# Patient Record
Sex: Female | Born: 1960 | Race: White | Hispanic: No | Marital: Married | Smoking: Never smoker
Health system: Southern US, Community
[De-identification: ages and names within clinical notes are randomized; demographics above are authoritative.]

## PROBLEM LIST (undated history)

## (undated) ENCOUNTER — Encounter

## (undated) ENCOUNTER — Encounter: Attending: Family | Primary: Family

## (undated) ENCOUNTER — Ambulatory Visit: Payer: PRIVATE HEALTH INSURANCE | Attending: Rheumatology | Primary: Rheumatology

## (undated) ENCOUNTER — Encounter: Attending: Ambulatory Care | Primary: Ambulatory Care

## (undated) ENCOUNTER — Encounter: Attending: Rheumatology | Primary: Rheumatology

## (undated) ENCOUNTER — Ambulatory Visit: Payer: PRIVATE HEALTH INSURANCE | Attending: Family | Primary: Family

## (undated) ENCOUNTER — Telehealth

## (undated) ENCOUNTER — Ambulatory Visit: Payer: PRIVATE HEALTH INSURANCE

## (undated) ENCOUNTER — Telehealth: Attending: Ambulatory Care | Primary: Ambulatory Care

## (undated) ENCOUNTER — Ambulatory Visit

## (undated) ENCOUNTER — Telehealth: Attending: Rheumatology | Primary: Rheumatology

## (undated) DIAGNOSIS — C439 Malignant melanoma of skin, unspecified: Secondary | ICD-10-CM

## (undated) DIAGNOSIS — E079 Disorder of thyroid, unspecified: Secondary | ICD-10-CM

## (undated) HISTORY — PX: OTHER SURGICAL HISTORY: SHX169

## (undated) HISTORY — PX: BREAST REDUCTION SURGERY: SHX8

## (undated) MED ORDER — BIOTIN ORAL: ORAL | 0.00000 days

## (undated) MED ORDER — FEXOFENADINE 180 MG TABLET: Freq: Every day | ORAL | 0.00000 days

## (undated) MED ORDER — FIBER ORAL: ORAL | 0.00000 days

## (undated) MED ORDER — MAGNESIUM 30 MG TABLET: Freq: Every evening | ORAL | 0.00000 days

## (undated) MED ORDER — CETIRIZINE 10 MG TABLET: Freq: Every day | ORAL | 0 days

## (undated) MED ORDER — CYANOCOBALAMIN (VIT B-12) 1,000 MCG TABLET: Freq: Every day | ORAL | 0.00000 days

## (undated) MED ORDER — ACETAMINOPHEN 500 MG TABLET: ORAL | 0 days

## (undated) MED ORDER — IBUPROFEN 200 MG TABLET: Freq: Four times a day (QID) | ORAL | 0 days | PRN

---

## 2002-02-10 ENCOUNTER — Other Ambulatory Visit: Admission: RE | Admit: 2002-02-10 | Discharge: 2002-02-10 | Payer: Self-pay | Admitting: Obstetrics and Gynecology

## 2003-08-03 ENCOUNTER — Other Ambulatory Visit: Admission: RE | Admit: 2003-08-03 | Discharge: 2003-08-03 | Payer: Self-pay | Admitting: Obstetrics and Gynecology

## 2004-09-02 ENCOUNTER — Other Ambulatory Visit: Admission: RE | Admit: 2004-09-02 | Discharge: 2004-09-02 | Payer: Self-pay | Admitting: Obstetrics and Gynecology

## 2005-11-11 ENCOUNTER — Other Ambulatory Visit: Admission: RE | Admit: 2005-11-11 | Discharge: 2005-11-11 | Payer: Self-pay | Admitting: Obstetrics and Gynecology

## 2006-08-24 ENCOUNTER — Encounter: Admission: RE | Admit: 2006-08-24 | Discharge: 2006-08-24 | Payer: Self-pay | Admitting: Obstetrics and Gynecology

## 2008-02-15 ENCOUNTER — Encounter: Admission: RE | Admit: 2008-02-15 | Discharge: 2008-02-15 | Payer: Self-pay | Admitting: Obstetrics and Gynecology

## 2009-07-27 ENCOUNTER — Encounter: Admission: RE | Admit: 2009-07-27 | Discharge: 2009-07-27 | Payer: Self-pay | Admitting: Obstetrics and Gynecology

## 2009-08-04 ENCOUNTER — Encounter: Admission: RE | Admit: 2009-08-04 | Discharge: 2009-08-04 | Payer: Self-pay | Admitting: Family Medicine

## 2009-08-18 ENCOUNTER — Emergency Department (HOSPITAL_COMMUNITY): Admission: EM | Admit: 2009-08-18 | Discharge: 2009-08-18 | Payer: Self-pay | Admitting: Emergency Medicine

## 2009-11-12 ENCOUNTER — Encounter: Admission: RE | Admit: 2009-11-12 | Discharge: 2009-11-12 | Payer: Self-pay | Admitting: Family Medicine

## 2010-07-29 ENCOUNTER — Encounter: Admission: RE | Admit: 2010-07-29 | Discharge: 2010-07-29 | Payer: Self-pay | Admitting: Obstetrics and Gynecology

## 2011-10-13 ENCOUNTER — Other Ambulatory Visit: Payer: Self-pay | Admitting: Obstetrics and Gynecology

## 2011-10-13 DIAGNOSIS — Z1231 Encounter for screening mammogram for malignant neoplasm of breast: Secondary | ICD-10-CM

## 2011-10-20 ENCOUNTER — Ambulatory Visit
Admission: RE | Admit: 2011-10-20 | Discharge: 2011-10-20 | Disposition: A | Discharge status: Home / Self Care | Payer: 59 | Source: Ambulatory Visit | Attending: Obstetrics and Gynecology | Admitting: Obstetrics and Gynecology

## 2011-10-20 DIAGNOSIS — Z1231 Encounter for screening mammogram for malignant neoplasm of breast: Secondary | ICD-10-CM

## 2011-12-03 ENCOUNTER — Other Ambulatory Visit: Payer: Self-pay

## 2011-12-03 ENCOUNTER — Encounter (HOSPITAL_BASED_OUTPATIENT_CLINIC_OR_DEPARTMENT_OTHER): Payer: Self-pay | Admitting: *Deleted

## 2011-12-03 ENCOUNTER — Emergency Department (HOSPITAL_BASED_OUTPATIENT_CLINIC_OR_DEPARTMENT_OTHER)
Admission: EM | Admit: 2011-12-03 | Discharge: 2011-12-03 | Disposition: A | Discharge status: Home / Self Care | Payer: 59 | Attending: Emergency Medicine | Admitting: Emergency Medicine

## 2011-12-03 ENCOUNTER — Emergency Department (INDEPENDENT_AMBULATORY_CARE_PROVIDER_SITE_OTHER): Payer: 59

## 2011-12-03 DIAGNOSIS — R0789 Other chest pain: Secondary | ICD-10-CM

## 2011-12-03 DIAGNOSIS — R002 Palpitations: Secondary | ICD-10-CM

## 2011-12-03 DIAGNOSIS — R079 Chest pain, unspecified: Secondary | ICD-10-CM

## 2011-12-03 DIAGNOSIS — Z79899 Other long term (current) drug therapy: Secondary | ICD-10-CM | POA: Insufficient documentation

## 2011-12-03 HISTORY — DX: Malignant melanoma of skin, unspecified: C43.9

## 2011-12-03 HISTORY — DX: Disorder of thyroid, unspecified: E07.9

## 2011-12-03 LAB — COMPREHENSIVE METABOLIC PANEL
Alkaline Phosphatase: 80 U/L (ref 39–117)
BUN: 12 mg/dL (ref 6–23)
Creatinine, Ser: 0.7 mg/dL (ref 0.50–1.10)
GFR calc Af Amer: >90 mL/min (ref >90)
Glucose, Bld: 141 mg/dL — ABNORMAL HIGH (ref 70–99)
Potassium: 3.8 mEq/L (ref 3.5–5.1)
Total Bilirubin: 0.2 mg/dL — ABNORMAL LOW (ref 0.3–1.2)
Total Protein: 7.2 g/dL (ref 6.0–8.3)

## 2011-12-03 LAB — CBC
HCT: 41.7 % (ref 36.0–46.0)
Hemoglobin: 14.4 g/dL (ref 12.0–15.0)
MCH: 32.5 pg (ref 26.0–34.0)
MCV: 94.1 fL (ref 78.0–100.0)
RBC: 4.43 MIL/uL (ref 3.87–5.11)

## 2011-12-03 LAB — DIFFERENTIAL
Eosinophils Absolute: 0.2 10*3/uL (ref 0.0–0.7)
Lymphs Abs: 2.8 10*3/uL (ref 0.7–4.0)
Monocytes Absolute: 0.5 10*3/uL (ref 0.1–1.0)
Monocytes Relative: 6 % (ref 3–12)
Neutrophils Relative %: 62 % (ref 43–77)

## 2011-12-03 NOTE — ED Provider Notes (Signed)
History     CSN: [id number]  Arrival date & time 12/03/11  1336   First MD Initiated Contact with Patient 12/03/11 1426      Chief Complaint  Patient presents with  . 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 female presenting with chest pain. The history is provided by the patient. No language interpreter was used.  Chest Pain The chest pain began 5 - 7 days ago. Chest pain occurs intermittently. The chest pain is unchanged. The severity of the pain is mild. The quality of the pain is described as pressure-like. The pain does not radiate. Primary symptoms include palpitations. Pertinent negatives for primary symptoms include no fever, no syncope, no shortness of breath, no cough, no wheezing, no abdominal pain, no nausea, no vomiting and no dizziness.  The palpitations did not occur with dizziness or shortness of breath.  Pertinent negatives for associated symptoms include no diaphoresis and no numbness. She tried nothing for the symptoms.     Past Medical History  Diagnosis Date  . Thyroid disease   . Melanoma     Past Surgical History  Procedure Date  . Melanoma removal     back   . Breast reduction surgery     No family history on file.  History  Substance Use Topics  . Smoking status: Never Smoker   . Smokeless  tobacco: Not on file  . Alcohol Use: Yes     occassionally    OB History    Grav Para Term Preterm Abortions TAB SAB Ect Mult Living                  Review of Systems  Constitutional: Negative for fever and diaphoresis.  Respiratory: Negative for cough, shortness of breath and wheezing.   Cardiovascular: Positive for chest pain and palpitations. Negative for syncope.  Gastrointestinal: Negative for nausea, vomiting and abdominal pain.  Neurological: Negative for dizziness and numbness.  All other systems reviewed and are negative.    Allergies  Review of patient's allergies indicates no known allergies.  Home Medications   Current Outpatient Rx  Name Route Sig Dispense Refill  . FAMOTIDINE-CA CARB-MAG HYDROX 10-800-165 MG PO CHEW Oral Chew 1 tablet by mouth daily as needed.    Marland Kitchen LANSOPRAZOLE 30 MG PO CPDR Oral Take 30 mg by mouth daily.    Marland Kitchen LEVONORGEST-ETH ESTRAD 91-DAY 0.15-0.03 MG PO TABS Oral Take 1 tablet by mouth daily.    Marland Kitchen LEVOTHYROXINE SODIUM 100 MCG PO TABS Oral Take 100 mcg by mouth daily.      BP 123/85  Pulse 83  Temp(Src) 98.2 F (36.8 C) (Oral)  Resp 20  Ht 5\' 5"  (1.651 m)  Wt 224 lb (101.606 kg)  BMI 37.28 kg/m2  SpO2 100%  Physical Exam  Nursing note and vitals reviewed. Constitutional: She is oriented to person, place,  and time. She appears well-developed and well-nourished.  HENT:  Head: Normocephalic and atraumatic.  Eyes: Conjunctivae and EOM are normal.  Neck: Neck supple.  Cardiovascular: Normal rate and regular rhythm.   Pulmonary/Chest: Effort normal and breath sounds normal. She exhibits no tenderness.  Abdominal: Soft. Bowel sounds are normal. There is no tenderness.  Musculoskeletal: Normal range of motion.  Neurological: She is alert and oriented to person, place, and time.  Skin: Skin is warm and dry.  Psychiatric: She has a normal mood and affect.    ED Course  Procedures (including critical care time)  Labs Reviewed    COMPREHENSIVE METABOLIC PANEL - Abnormal; Notable for the following:    Glucose, Bld 141 (*)    Total Bilirubin 0.2 (*)    All other components within normal limits  CARDIAC PANEL(CRET KIN+CKTOT+MB+TROPI)  CBC  DIFFERENTIAL   No results found.  Date: 12/03/2011  Rate: 82  Rhythm: normal sinus rhythm  QRS Axis: normal  Intervals: normal  ST/T Wave abnormalities: normal  Conduction Disutrbances:none  Narrative Interpretation:   Old EKG Reviewed: none available    1. Palpitations       MDM  No acute findings:discussed with pt about setting up holter monitor on outpt basis:doubt acs and pe        Teressa Lower, NP 12/03/11 1543

## 2011-12-03 NOTE — Discharge Instructions (Signed)
Palpitations  A palpitation is the feeling that your heartbeat is irregular or is faster than normal. Although this is frightening, it usually is not serious. Palpitations may be caused by excesses of smoking, caffeine, or alcohol. They are also brought on by stress and anxiety. Sometimes, they are caused by heart disease. Unless otherwise noted, your caregiver did not find any signs of serious illness at this time. HOME CARE INSTRUCTIONS  To help prevent palpitations:  Drink decaffeinated coffee, tea, and soda pop. Avoid chocolate.   If you smoke or drink alcohol, quit or cut down as much as possible.   Reduce your stress or anxiety level. Biofeedback, yoga, or meditation will help you relax. Physical activity such as swimming, jogging, or walking also may be helpful.  SEEK MEDICAL CARE IF:   You continue to have a fast heartbeat.   Your palpitations occur more often.  SEEK IMMEDIATE MEDICAL CARE IF: You develop chest pain, shortness of breath, severe headache, dizziness, or fainting. Document Released: 08/29/2000 Document Revised: 08/21/2011 Document Reviewed: 10/29/2007 ExitCare Patient Information 2012 [employer]. 

## 2011-12-03 NOTE — ED Notes (Signed)
Patient states she has had a tightness and discomfort in the center of her chest one week ago.  Felt like indigestion.  Yesterday after eating lunch and walking for 30 minutes she felt the discomfort and checked her pulse.  States she felt a skipping in her heart rate.  Was seen today by her pcp for a routine physical, had a normal ekg and is going to schedule a stress test.

## 2011-12-03 NOTE — ED Notes (Signed)
Lonna Cobb NP has been in to see Pt. With reports of blood work and x ray

## 2011-12-04 NOTE — ED Provider Notes (Signed)
Medical screening examination/treatment/procedure(s) were performed by non-physician practitioner and as supervising physician I was immediately available for consultation/collaboration.    Nelia Shi, MD 12/04/11 [phone]

## 2013-01-05 ENCOUNTER — Other Ambulatory Visit: Payer: Self-pay

## 2013-01-05 DIAGNOSIS — Z1231 Encounter for screening mammogram for malignant neoplasm of breast: Secondary | ICD-10-CM

## 2013-01-05 DIAGNOSIS — Z803 Family history of malignant neoplasm of breast: Secondary | ICD-10-CM

## 2013-01-05 DIAGNOSIS — Z9889 Other specified postprocedural states: Secondary | ICD-10-CM

## 2013-02-11 ENCOUNTER — Ambulatory Visit
Admission: RE | Admit: 2013-02-11 | Discharge: 2013-02-11 | Disposition: A | Discharge status: Home / Self Care | Payer: 59 | Source: Ambulatory Visit

## 2013-02-11 DIAGNOSIS — Z1231 Encounter for screening mammogram for malignant neoplasm of breast: Secondary | ICD-10-CM

## 2013-02-11 DIAGNOSIS — Z9889 Other specified postprocedural states: Secondary | ICD-10-CM

## 2013-02-11 DIAGNOSIS — Z803 Family history of malignant neoplasm of breast: Secondary | ICD-10-CM

## 2015-04-09 ENCOUNTER — Other Ambulatory Visit: Payer: Self-pay | Admitting: Family Medicine

## 2015-04-09 DIAGNOSIS — R1030 Lower abdominal pain, unspecified: Secondary | ICD-10-CM

## 2015-04-10 ENCOUNTER — Ambulatory Visit
Admission: RE | Admit: 2015-04-10 | Discharge: 2015-04-10 | Disposition: A | Discharge status: Home / Self Care | Payer: 59 | Source: Ambulatory Visit | Attending: Family Medicine | Admitting: Family Medicine

## 2015-04-10 DIAGNOSIS — R1030 Lower abdominal pain, unspecified: Secondary | ICD-10-CM

## 2015-04-10 MED ORDER — IOPAMIDOL (ISOVUE-300) INJECTION 61%
125.0000 mL | Freq: Once | INTRAVENOUS | Status: AC | PRN
  Administered 2015-04-10: 125 mL via INTRAVENOUS

## 2015-04-20 ENCOUNTER — Other Ambulatory Visit: Payer: Self-pay | Admitting: Family Medicine

## 2015-04-20 DIAGNOSIS — R1030 Lower abdominal pain, unspecified: Secondary | ICD-10-CM

## 2015-04-23 ENCOUNTER — Other Ambulatory Visit: Payer: Self-pay | Admitting: Family Medicine

## 2015-04-23 DIAGNOSIS — N2889 Other specified disorders of kidney and ureter: Secondary | ICD-10-CM

## 2015-04-30 ENCOUNTER — Ambulatory Visit
Admission: RE | Admit: 2015-04-30 | Discharge: 2015-04-30 | Disposition: A | Discharge status: Home / Self Care | Payer: 59 | Source: Ambulatory Visit | Attending: Family Medicine | Admitting: Family Medicine

## 2015-04-30 DIAGNOSIS — R1030 Lower abdominal pain, unspecified: Secondary | ICD-10-CM

## 2015-04-30 MED ORDER — IOPAMIDOL (ISOVUE-300) INJECTION 61%
100.0000 mL | Freq: Once | INTRAVENOUS | Status: AC | PRN
  Administered 2015-04-30: 100 mL via INTRAVENOUS

## 2015-05-25 ENCOUNTER — Other Ambulatory Visit: Payer: 59

## 2015-06-13 ENCOUNTER — Ambulatory Visit
Admission: RE | Admit: 2015-06-13 | Discharge: 2015-06-13 | Disposition: A | Discharge status: Home / Self Care | Payer: 59 | Source: Ambulatory Visit | Attending: Family Medicine | Admitting: Family Medicine

## 2015-06-13 DIAGNOSIS — N2889 Other specified disorders of kidney and ureter: Secondary | ICD-10-CM

## 2015-06-13 MED ORDER — GADOBENATE DIMEGLUMINE 529 MG/ML IV SOLN
19.0000 mL | Freq: Once | INTRAVENOUS | Status: AC | PRN
  Administered 2015-06-13: 19 mL via INTRAVENOUS

## 2016-10-03 DIAGNOSIS — R5382 Chronic fatigue, unspecified: Secondary | ICD-10-CM | POA: Diagnosis not present

## 2016-10-03 DIAGNOSIS — M2550 Pain in unspecified joint: Secondary | ICD-10-CM | POA: Diagnosis not present

## 2016-10-10 DIAGNOSIS — M2550 Pain in unspecified joint: Secondary | ICD-10-CM | POA: Diagnosis not present

## 2016-10-10 DIAGNOSIS — R5382 Chronic fatigue, unspecified: Secondary | ICD-10-CM | POA: Diagnosis not present

## 2016-10-10 DIAGNOSIS — R768 Other specified abnormal immunological findings in serum: Secondary | ICD-10-CM | POA: Diagnosis not present

## 2016-11-12 DIAGNOSIS — R899 Unspecified abnormal finding in specimens from other organs, systems and tissues: Secondary | ICD-10-CM | POA: Diagnosis not present

## 2016-11-12 DIAGNOSIS — R5382 Chronic fatigue, unspecified: Secondary | ICD-10-CM | POA: Diagnosis not present

## 2016-12-11 DIAGNOSIS — M25511 Pain in right shoulder: Secondary | ICD-10-CM | POA: Diagnosis not present

## 2016-12-11 DIAGNOSIS — G8929 Other chronic pain: Secondary | ICD-10-CM | POA: Diagnosis not present

## 2016-12-11 DIAGNOSIS — M25512 Pain in left shoulder: Secondary | ICD-10-CM | POA: Diagnosis not present

## 2016-12-18 DIAGNOSIS — R945 Abnormal results of liver function studies: Secondary | ICD-10-CM | POA: Diagnosis not present

## 2016-12-24 DIAGNOSIS — D2271 Melanocytic nevi of right lower limb, including hip: Secondary | ICD-10-CM | POA: Diagnosis not present

## 2016-12-24 DIAGNOSIS — L821 Other seborrheic keratosis: Secondary | ICD-10-CM | POA: Diagnosis not present

## 2016-12-24 DIAGNOSIS — Z85828 Personal history of other malignant neoplasm of skin: Secondary | ICD-10-CM | POA: Diagnosis not present

## 2016-12-24 DIAGNOSIS — D485 Neoplasm of uncertain behavior of skin: Secondary | ICD-10-CM | POA: Diagnosis not present

## 2016-12-24 DIAGNOSIS — D225 Melanocytic nevi of trunk: Secondary | ICD-10-CM | POA: Diagnosis not present

## 2017-01-01 DIAGNOSIS — M25511 Pain in right shoulder: Secondary | ICD-10-CM | POA: Diagnosis not present

## 2017-01-01 DIAGNOSIS — G8929 Other chronic pain: Secondary | ICD-10-CM | POA: Diagnosis not present

## 2017-01-01 DIAGNOSIS — M25512 Pain in left shoulder: Secondary | ICD-10-CM | POA: Diagnosis not present

## 2017-01-23 DIAGNOSIS — S2020XA Contusion of thorax, unspecified, initial encounter: Secondary | ICD-10-CM | POA: Diagnosis not present

## 2017-01-23 DIAGNOSIS — S20211A Contusion of right front wall of thorax, initial encounter: Secondary | ICD-10-CM | POA: Diagnosis not present

## 2017-01-23 DIAGNOSIS — S8011XA Contusion of right lower leg, initial encounter: Secondary | ICD-10-CM | POA: Diagnosis not present

## 2017-01-23 DIAGNOSIS — W010XXA Fall on same level from slipping, tripping and stumbling without subsequent striking against object, initial encounter: Secondary | ICD-10-CM | POA: Diagnosis not present

## 2017-01-23 DIAGNOSIS — R079 Chest pain, unspecified: Secondary | ICD-10-CM | POA: Diagnosis not present

## 2017-01-23 DIAGNOSIS — S40021A Contusion of right upper arm, initial encounter: Secondary | ICD-10-CM | POA: Diagnosis not present

## 2017-04-16 DIAGNOSIS — Z1231 Encounter for screening mammogram for malignant neoplasm of breast: Secondary | ICD-10-CM | POA: Diagnosis not present

## 2017-04-16 DIAGNOSIS — Z1389 Encounter for screening for other disorder: Secondary | ICD-10-CM | POA: Diagnosis not present

## 2017-04-16 DIAGNOSIS — Z01419 Encounter for gynecological examination (general) (routine) without abnormal findings: Secondary | ICD-10-CM | POA: Diagnosis not present

## 2017-04-16 DIAGNOSIS — Z130 Encounter for screening for diseases of the blood and blood-forming organs and certain disorders involving the immune mechanism: Secondary | ICD-10-CM | POA: Diagnosis not present

## 2017-04-23 DIAGNOSIS — E039 Hypothyroidism, unspecified: Secondary | ICD-10-CM | POA: Diagnosis not present

## 2017-04-23 DIAGNOSIS — M130 Polyarthritis, unspecified: Secondary | ICD-10-CM | POA: Diagnosis not present

## 2017-05-14 DIAGNOSIS — M25511 Pain in right shoulder: Secondary | ICD-10-CM | POA: Diagnosis not present

## 2017-05-14 DIAGNOSIS — M25512 Pain in left shoulder: Secondary | ICD-10-CM | POA: Diagnosis not present

## 2017-05-14 DIAGNOSIS — G8929 Other chronic pain: Secondary | ICD-10-CM | POA: Diagnosis not present

## 2017-05-22 DIAGNOSIS — K648 Other hemorrhoids: Secondary | ICD-10-CM | POA: Diagnosis not present

## 2017-05-22 DIAGNOSIS — Z86010 Personal history of colonic polyps: Secondary | ICD-10-CM | POA: Diagnosis not present

## 2017-06-04 DIAGNOSIS — M2550 Pain in unspecified joint: Secondary | ICD-10-CM | POA: Diagnosis not present

## 2017-06-15 DIAGNOSIS — G8929 Other chronic pain: Secondary | ICD-10-CM | POA: Diagnosis not present

## 2017-06-15 DIAGNOSIS — M25512 Pain in left shoulder: Secondary | ICD-10-CM | POA: Diagnosis not present

## 2017-06-15 DIAGNOSIS — M25511 Pain in right shoulder: Secondary | ICD-10-CM | POA: Diagnosis not present

## 2017-06-26 DIAGNOSIS — D2272 Melanocytic nevi of left lower limb, including hip: Secondary | ICD-10-CM | POA: Diagnosis not present

## 2017-06-26 DIAGNOSIS — D2271 Melanocytic nevi of right lower limb, including hip: Secondary | ICD-10-CM | POA: Diagnosis not present

## 2017-06-26 DIAGNOSIS — L821 Other seborrheic keratosis: Secondary | ICD-10-CM | POA: Diagnosis not present

## 2017-07-16 DIAGNOSIS — M2550 Pain in unspecified joint: Secondary | ICD-10-CM | POA: Diagnosis not present

## 2017-07-31 DIAGNOSIS — E538 Deficiency of other specified B group vitamins: Secondary | ICD-10-CM | POA: Diagnosis not present

## 2017-07-31 DIAGNOSIS — G8929 Other chronic pain: Secondary | ICD-10-CM | POA: Diagnosis not present

## 2017-07-31 DIAGNOSIS — R5383 Other fatigue: Secondary | ICD-10-CM | POA: Diagnosis not present

## 2017-07-31 DIAGNOSIS — M2550 Pain in unspecified joint: Secondary | ICD-10-CM | POA: Diagnosis not present

## 2017-08-28 DIAGNOSIS — N951 Menopausal and female climacteric states: Secondary | ICD-10-CM | POA: Diagnosis not present

## 2017-08-28 DIAGNOSIS — E039 Hypothyroidism, unspecified: Secondary | ICD-10-CM | POA: Diagnosis not present

## 2017-08-28 DIAGNOSIS — E538 Deficiency of other specified B group vitamins: Secondary | ICD-10-CM | POA: Diagnosis not present

## 2017-09-22 DIAGNOSIS — H04123 Dry eye syndrome of bilateral lacrimal glands: Secondary | ICD-10-CM | POA: Diagnosis not present

## 2017-10-19 DIAGNOSIS — M2550 Pain in unspecified joint: Secondary | ICD-10-CM | POA: Diagnosis not present

## 2017-10-29 DIAGNOSIS — H0019 Chalazion unspecified eye, unspecified eyelid: Secondary | ICD-10-CM | POA: Diagnosis not present

## 2017-11-05 DIAGNOSIS — M2550 Pain in unspecified joint: Secondary | ICD-10-CM | POA: Diagnosis not present

## 2017-11-05 DIAGNOSIS — R5382 Chronic fatigue, unspecified: Secondary | ICD-10-CM | POA: Diagnosis not present

## 2017-11-13 DIAGNOSIS — H00015 Hordeolum externum left lower eyelid: Secondary | ICD-10-CM | POA: Diagnosis not present

## 2017-11-13 DIAGNOSIS — H0015 Chalazion left lower eyelid: Secondary | ICD-10-CM | POA: Diagnosis not present

## 2017-11-23 DIAGNOSIS — E039 Hypothyroidism, unspecified: Secondary | ICD-10-CM | POA: Diagnosis not present

## 2017-11-23 DIAGNOSIS — E538 Deficiency of other specified B group vitamins: Secondary | ICD-10-CM | POA: Diagnosis not present

## 2017-11-23 DIAGNOSIS — N951 Menopausal and female climacteric states: Secondary | ICD-10-CM | POA: Diagnosis not present

## 2017-11-30 DIAGNOSIS — N951 Menopausal and female climacteric states: Secondary | ICD-10-CM | POA: Diagnosis not present

## 2017-11-30 DIAGNOSIS — E039 Hypothyroidism, unspecified: Secondary | ICD-10-CM | POA: Diagnosis not present

## 2017-11-30 DIAGNOSIS — E538 Deficiency of other specified B group vitamins: Secondary | ICD-10-CM | POA: Diagnosis not present

## 2017-12-15 DIAGNOSIS — Z86018 Personal history of other benign neoplasm: Secondary | ICD-10-CM | POA: Diagnosis not present

## 2017-12-15 DIAGNOSIS — D225 Melanocytic nevi of trunk: Secondary | ICD-10-CM | POA: Diagnosis not present

## 2017-12-15 DIAGNOSIS — D2272 Melanocytic nevi of left lower limb, including hip: Secondary | ICD-10-CM | POA: Diagnosis not present

## 2017-12-16 ENCOUNTER — Ambulatory Visit: Admit: 2017-12-16 | Discharge: 2017-12-16 | Payer: PRIVATE HEALTH INSURANCE

## 2017-12-16 ENCOUNTER — Ambulatory Visit
Admit: 2017-12-16 | Discharge: 2017-12-16 | Payer: PRIVATE HEALTH INSURANCE | Attending: Rheumatology | Primary: Rheumatology

## 2017-12-16 DIAGNOSIS — M130 Polyarthritis, unspecified: Principal | ICD-10-CM

## 2017-12-16 DIAGNOSIS — M2540 Effusion, unspecified joint: Secondary | ICD-10-CM

## 2017-12-16 DIAGNOSIS — M8580 Other specified disorders of bone density and structure, unspecified site: Secondary | ICD-10-CM | POA: Diagnosis not present

## 2017-12-16 DIAGNOSIS — E039 Hypothyroidism, unspecified: Secondary | ICD-10-CM | POA: Diagnosis not present

## 2017-12-16 DIAGNOSIS — M25461 Effusion, right knee: Secondary | ICD-10-CM | POA: Diagnosis not present

## 2017-12-31 MED ORDER — PREDNISONE 10 MG TABLET
ORAL_TABLET | Freq: Every day | ORAL | 1 refills | 0 days | Status: CP
Start: 2017-12-31 — End: 2018-07-29

## 2017-12-31 MED ORDER — HYDROXYCHLOROQUINE 200 MG TABLET
ORAL_TABLET | Freq: Two times a day (BID) | ORAL | 3 refills | 0.00000 days | Status: CP
Start: 2017-12-31 — End: 2018-04-01

## 2018-01-08 ENCOUNTER — Other Ambulatory Visit: Payer: Self-pay | Admitting: Family Medicine

## 2018-01-08 DIAGNOSIS — R7989 Other specified abnormal findings of blood chemistry: Secondary | ICD-10-CM

## 2018-01-08 DIAGNOSIS — R945 Abnormal results of liver function studies: Principal | ICD-10-CM

## 2018-01-11 ENCOUNTER — Other Ambulatory Visit: Payer: Self-pay | Admitting: Family Medicine

## 2018-01-11 ENCOUNTER — Ambulatory Visit
Admission: RE | Admit: 2018-01-11 | Discharge: 2018-01-11 | Disposition: A | Discharge status: Home / Self Care | Payer: 59 | Source: Ambulatory Visit | Attending: Family Medicine | Admitting: Family Medicine

## 2018-01-11 DIAGNOSIS — K8020 Calculus of gallbladder without cholecystitis without obstruction: Secondary | ICD-10-CM | POA: Diagnosis not present

## 2018-01-11 DIAGNOSIS — Z79899 Other long term (current) drug therapy: Secondary | ICD-10-CM

## 2018-01-11 DIAGNOSIS — R945 Abnormal results of liver function studies: Principal | ICD-10-CM

## 2018-01-11 DIAGNOSIS — R7989 Other specified abnormal findings of blood chemistry: Secondary | ICD-10-CM

## 2018-01-12 DIAGNOSIS — H524 Presbyopia: Secondary | ICD-10-CM | POA: Diagnosis not present

## 2018-01-12 DIAGNOSIS — M069 Rheumatoid arthritis, unspecified: Secondary | ICD-10-CM | POA: Diagnosis not present

## 2018-01-12 DIAGNOSIS — Z79899 Other long term (current) drug therapy: Secondary | ICD-10-CM | POA: Diagnosis not present

## 2018-01-25 DIAGNOSIS — R945 Abnormal results of liver function studies: Secondary | ICD-10-CM | POA: Diagnosis not present

## 2018-01-25 DIAGNOSIS — M05771 Rheumatoid arthritis with rheumatoid factor of right ankle and foot without organ or systems involvement: Secondary | ICD-10-CM | POA: Diagnosis not present

## 2018-04-01 MED ORDER — HYDROXYCHLOROQUINE 200 MG TABLET
ORAL_TABLET | Freq: Two times a day (BID) | ORAL | 11 refills | 0 days | Status: CP
Start: 2018-04-01 — End: 2019-03-26

## 2018-05-12 DIAGNOSIS — Z124 Encounter for screening for malignant neoplasm of cervix: Secondary | ICD-10-CM | POA: Diagnosis not present

## 2018-05-12 DIAGNOSIS — Z01419 Encounter for gynecological examination (general) (routine) without abnormal findings: Secondary | ICD-10-CM | POA: Diagnosis not present

## 2018-05-12 DIAGNOSIS — Z6835 Body mass index (BMI) 35.0-35.9, adult: Secondary | ICD-10-CM | POA: Diagnosis not present

## 2018-05-12 DIAGNOSIS — Z1231 Encounter for screening mammogram for malignant neoplasm of breast: Secondary | ICD-10-CM | POA: Diagnosis not present

## 2018-05-13 DIAGNOSIS — Z124 Encounter for screening for malignant neoplasm of cervix: Secondary | ICD-10-CM | POA: Diagnosis not present

## 2018-06-16 DIAGNOSIS — Z86018 Personal history of other benign neoplasm: Secondary | ICD-10-CM | POA: Diagnosis not present

## 2018-06-16 DIAGNOSIS — D2272 Melanocytic nevi of left lower limb, including hip: Secondary | ICD-10-CM | POA: Diagnosis not present

## 2018-06-16 DIAGNOSIS — D225 Melanocytic nevi of trunk: Secondary | ICD-10-CM | POA: Diagnosis not present

## 2018-06-23 ENCOUNTER — Ambulatory Visit: Admit: 2018-06-23 | Discharge: 2018-06-24 | Payer: PRIVATE HEALTH INSURANCE

## 2018-06-23 DIAGNOSIS — M059 Rheumatoid arthritis with rheumatoid factor, unspecified: Principal | ICD-10-CM

## 2018-06-23 DIAGNOSIS — Z780 Asymptomatic menopausal state: Secondary | ICD-10-CM | POA: Diagnosis not present

## 2018-06-23 DIAGNOSIS — E039 Hypothyroidism, unspecified: Secondary | ICD-10-CM | POA: Diagnosis not present

## 2018-06-23 MED ORDER — METHOTREXATE SODIUM 2.5 MG TABLET
ORAL_TABLET | 3 refills | 0 days | Status: CP
Start: 2018-06-23 — End: 2018-11-12

## 2018-06-23 MED ORDER — FOLIC ACID 1 MG TABLET
ORAL_TABLET | Freq: Every day | ORAL | 3 refills | 0 days | Status: CP
Start: 2018-06-23 — End: 2019-06-23

## 2018-07-29 DIAGNOSIS — E039 Hypothyroidism, unspecified: Secondary | ICD-10-CM | POA: Diagnosis not present

## 2018-07-29 DIAGNOSIS — M0579 Rheumatoid arthritis with rheumatoid factor of multiple sites without organ or systems involvement: Secondary | ICD-10-CM | POA: Diagnosis not present

## 2018-07-29 MED ORDER — PREDNISONE 10 MG TABLET
ORAL_TABLET | Freq: Every day | ORAL | 0 refills | 0 days | Status: CP
Start: 2018-07-29 — End: 2018-10-07

## 2018-10-07 ENCOUNTER — Ambulatory Visit: Admit: 2018-10-07 | Discharge: 2018-10-08 | Payer: PRIVATE HEALTH INSURANCE

## 2018-10-07 DIAGNOSIS — Z79899 Other long term (current) drug therapy: Secondary | ICD-10-CM

## 2018-10-07 DIAGNOSIS — M059 Rheumatoid arthritis with rheumatoid factor, unspecified: Principal | ICD-10-CM

## 2018-10-07 MED ORDER — PREDNISONE 10 MG TABLET
ORAL_TABLET | Freq: Every day | ORAL | 0 refills | 0.00000 days | Status: CP
Start: 2018-10-07 — End: 2018-10-20

## 2018-10-18 ENCOUNTER — Ambulatory Visit: Admit: 2018-10-18 | Discharge: 2018-10-19 | Payer: PRIVATE HEALTH INSURANCE

## 2018-10-18 DIAGNOSIS — M059 Rheumatoid arthritis with rheumatoid factor, unspecified: Principal | ICD-10-CM

## 2018-10-18 DIAGNOSIS — M19012 Primary osteoarthritis, left shoulder: Secondary | ICD-10-CM | POA: Diagnosis not present

## 2018-10-18 DIAGNOSIS — M19011 Primary osteoarthritis, right shoulder: Secondary | ICD-10-CM | POA: Diagnosis not present

## 2018-10-20 MED ORDER — PREDNISONE 5 MG TABLET
ORAL_TABLET | 1 refills | 0 days | Status: CP
Start: 2018-10-20 — End: 2018-12-27

## 2018-10-31 ENCOUNTER — Ambulatory Visit: Admit: 2018-10-31 | Discharge: 2018-11-01 | Payer: PRIVATE HEALTH INSURANCE

## 2018-10-31 DIAGNOSIS — M25531 Pain in right wrist: Principal | ICD-10-CM

## 2018-10-31 DIAGNOSIS — S52501A Unspecified fracture of the lower end of right radius, initial encounter for closed fracture: Secondary | ICD-10-CM

## 2018-10-31 DIAGNOSIS — W19XXXA Unspecified fall, initial encounter: Secondary | ICD-10-CM

## 2018-11-02 DIAGNOSIS — M25531 Pain in right wrist: Secondary | ICD-10-CM | POA: Diagnosis not present

## 2018-11-12 MED ORDER — METHOTREXATE SODIUM 2.5 MG TABLET
ORAL_TABLET | 3 refills | 0 days | Status: CP
Start: 2018-11-12 — End: 2019-01-19

## 2018-11-23 DIAGNOSIS — M25531 Pain in right wrist: Secondary | ICD-10-CM | POA: Diagnosis not present

## 2018-11-23 DIAGNOSIS — S52551A Other extraarticular fracture of lower end of right radius, initial encounter for closed fracture: Secondary | ICD-10-CM | POA: Diagnosis not present

## 2018-11-23 DIAGNOSIS — M25511 Pain in right shoulder: Secondary | ICD-10-CM | POA: Diagnosis not present

## 2018-11-23 DIAGNOSIS — G8929 Other chronic pain: Secondary | ICD-10-CM | POA: Diagnosis not present

## 2018-12-14 DIAGNOSIS — S52691A Other fracture of lower end of right ulna, initial encounter for closed fracture: Secondary | ICD-10-CM | POA: Diagnosis not present

## 2018-12-28 ENCOUNTER — Institutional Professional Consult (permissible substitution): Admit: 2018-12-28 | Discharge: 2018-12-29 | Payer: PRIVATE HEALTH INSURANCE

## 2018-12-28 DIAGNOSIS — M059 Rheumatoid arthritis with rheumatoid factor, unspecified: Principal | ICD-10-CM

## 2018-12-28 MED ORDER — PREDNISONE 5 MG TABLET
ORAL_TABLET | Freq: Two times a day (BID) | ORAL | 1 refills | 0.00000 days | Status: CP
Start: 2018-12-28 — End: 2019-03-28

## 2019-01-13 DIAGNOSIS — S52551A Other extraarticular fracture of lower end of right radius, initial encounter for closed fracture: Secondary | ICD-10-CM | POA: Diagnosis not present

## 2019-01-19 MED ORDER — METHOTREXATE SODIUM 2.5 MG TABLET
ORAL_TABLET | 3 refills | 0 days | Status: CP
Start: 2019-01-19 — End: 2019-04-14

## 2019-01-25 DIAGNOSIS — H2513 Age-related nuclear cataract, bilateral: Secondary | ICD-10-CM | POA: Diagnosis not present

## 2019-01-25 DIAGNOSIS — H5203 Hypermetropia, bilateral: Secondary | ICD-10-CM | POA: Diagnosis not present

## 2019-01-25 DIAGNOSIS — Z79899 Other long term (current) drug therapy: Secondary | ICD-10-CM | POA: Diagnosis not present

## 2019-02-04 DIAGNOSIS — E785 Hyperlipidemia, unspecified: Secondary | ICD-10-CM | POA: Diagnosis not present

## 2019-02-04 DIAGNOSIS — E039 Hypothyroidism, unspecified: Secondary | ICD-10-CM | POA: Diagnosis not present

## 2019-03-26 MED ORDER — HYDROXYCHLOROQUINE 200 MG TABLET
ORAL_TABLET | 11 refills | 0 days | Status: CP
Start: 2019-03-26 — End: ?

## 2019-03-29 ENCOUNTER — Institutional Professional Consult (permissible substitution): Admit: 2019-03-29 | Discharge: 2019-03-30 | Payer: PRIVATE HEALTH INSURANCE

## 2019-03-29 DIAGNOSIS — M059 Rheumatoid arthritis with rheumatoid factor, unspecified: Principal | ICD-10-CM

## 2019-03-29 DIAGNOSIS — Z79899 Other long term (current) drug therapy: Secondary | ICD-10-CM

## 2019-03-29 MED ORDER — ENBREL SURECLICK 50 MG/ML (1 ML) SUBCUTANEOUS PEN INJECTOR
SUBCUTANEOUS | 3 refills | 84.00000 days | Status: CP
Start: 2019-03-29 — End: 2019-04-05

## 2019-03-29 NOTE — Unmapped (Signed)
Per test claim for Enbrel at the [facility], patient needs Medication Assistance Program for Prior Authorization.

## 2019-03-29 NOTE — Unmapped (Signed)
I spent 21 minutes on the phone with the patient. I spent an additional 10 minutes on pre- and post-visit activities.     The patient was physically located in [place] or a state in which I am permitted to provide care. The patient and/or parent/guardian understood that s/he may incur co-pays and cost sharing, and agreed to the telemedicine visit. The visit was reasonable and appropriate under the circumstances given the patient's presentation at the time.    The patient and/or parent/guardian has been advised of the potential risks and limitations of this mode of treatment (including, but not limited to, the absence of in-person examination) and has agreed to be treated using telemedicine. The patient's/patient's family's questions regarding telemedicine have been answered.     If the visit was completed in an ambulatory setting, the patient and/or parent/guardian has also been advised to contact their provider???s office for worsening conditions, and seek emergency medical treatment and/or call 911 if the patient deems either necessary.         REASON FOR VISIT: with hx of seropositive (+RF, +CCP) non-erosive RA diagnosed in 2019.  Treatment history:  Prednisone started by PCP  HCQ started due to mild transaminitis at initial visit with rheumatology.   MTX added in 06/2018 due to persistent symptoms.   ??    Identification: Pt self identified using name and date of birth  Patient location: [place]  The limitations of this telemedicine encounter were discussed with patient. Both the patient and myself agreed to this encounter despite these limitations. Benefits of this telemedicine encounter included allowing for continued care of patient and minimizing risk of exposure to COVID-19.     HISTORY: Ms. April Contreras is a 58 y.o. female with hx of seropositive (+RF, +CCP) non-erosive RA diagnosed in 2019.  Treatment history:  Prednisone started by PCP  HCQ started due to mild transaminitis at initial visit with rheumatology. MTX added in 06/2018 due to persistent symptoms.   ??    Interim history:   Pt presents via phone call for follow up.     She was recently diagnosed by her PCP with eustachian tube disorder.  She will start on a prednisone taper and a 10-day course of amoxicillin, this was just prescribed and she has not yet picked this up.    She states she is doing the same today she has been doing recently.  She has been unable to taper off prednisone due to severe pain in the hands, ankles, knees.  Even on 10 mg prednisone today she still has pain in the hands, ankles, knees.  And swelling in the fingers.  Morning stiffness lasting several hours.    She reports a history of melanoma which was treated and has been in remission for 15 years or more.  She continues close follow-up with dermatology for yearly full body skin exams.        CURRENT MEDICATIONS:  Current Outpatient Medications   Medication Sig Dispense Refill   ??? ascorbic acid (VITAMIN C ORAL) Take 520 mg by mouth Two (2) times a day.     ??? calcium carbonate/vitamin D3 (CALCIUM 500 + D, D3, ORAL) Take by mouth.     ??? fexofenadine (ALLEGRA) 180 MG tablet Take 180 mg by mouth daily.     ??? folic acid (FOLVITE) 1 MG tablet Take 1 tablet (1 mg total) by mouth daily. 100 tablet 3   ??? hydrOXYchloroQUINE (PLAQUENIL) 200 mg tablet TAKE 1 TABLET BY MOUTH TWO TIMES A  DAY. 60 tablet 11   ??? IBUPROFEN ORAL Take by mouth daily as needed.     ??? levothyroxine (SYNTHROID, LEVOTHROID) 175 MCG tablet Take 175 mcg by mouth daily.     ??? methotrexate 2.5 MG tablet Take 8 tablets weekly. 40 tablet 3   ??? multivitamin (TAB-A-VITE/THERAGRAN) per tablet Take 1 tablet by mouth daily.     ??? omega-3s/dha/epa/fish oil (OMEGA 3 ORAL) Take by mouth. Take 2 tablets twice a day     ??? turmeric root extract 500 mg cap Take by mouth Two (2) times a day.     ??? UNABLE TO FIND Med Name: Hair Anew, take 2 tablets daily     ??? zinc gluconate 50 mg tablet Take 50 mg by mouth daily.       No current facility-administered medications for this visit.        Past Medical History:   Diagnosis Date   ??? Cancer (CMS-HCC)     Melanoma year ago   ??? Frozen shoulder 2015   ??? Hypothyroidism    ??? Melanoma (CMS-HCC)    ??? Rheumatoid arthritis (CMS-HCC)    ??? Trigger finger 2015        Record Review: Available records were reviewed, including pertinent office visits, labs, and imaging.      REVIEW OF SYSTEMS: Ten system were reviewed and negative except as noted above.    PHYSICAL EXAM:  Patient reported vitals:  There were no vitals filed for this visit.   General:   Does not sound to be in distress   Lungs:  No wheezing, coughing, or increased respiratory effort noted   Psych:  Appropriate interaction     TB gold with [facility] 12/16/17 indeterminate  TB gold with PCP 02/01/18 negative (scanned into media)    ASSESSMENT/PLAN:  1. Seropositive rheumatoid arthritis (CMS-HCC)  With concern for persistent RA activity, recommend escalating therapy. I recommend beginning therapy with a TNF inhibitor. I discussed the risks of use to include injection site reaction, allergic reaction like anaphylaxis, and serious infection. I also discussed the risk of malignancy and demyelinating disease. Pt is to seek medical attention for any signs/symptoms of infection so that this may be properly treated. Pt verbalizes understanding and wishes to proceed with therapy.   She has had negative hepatitis screening last year.  TB gold done with [facility] was indeterminate, but repeat TB Gold with PCP last year was negative.  Start Enbrel 50 mg subcu weekly.  Continue methotrexate 20 mg weekly, folic acid 1 mg daily, Plaquenil 200 mg twice daily.  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  She will continue prednisone 10 mg daily for now.  After 3 to 4 weeks on Enbrel she may consider tapering prednisone 2.5 mg weekly until off.  - ENBREL SURECLICK 50 MG/ML (0.98 ML) SUBCUTANEOUS PEN; Inject 1 mL (50 mg total) under the skin once a week.  Dispense: 12 pen; Refill: 3    2. Methotrexate, long term, current use  She reports being up-to-date with labs done by PCP.  These most recent lab results are not available to me.  I asked her to have her PCP fax the results to [place] again.    3.  Eustachian tube dysfunction  Should not take methotrexate while she is taking amoxicillin, due to interaction.  Should not start Enbrel until she has completed amoxicillin as well.  She will continue follow-up with PCP.  HCM:   - PCV13 Status: declines          - PPSV 23 Status: declines   - Annual Influenza vaccine. Status: declines  - Bone health: continue calcium and vitamin D supplements daily while on prednisone.   - Plaquenil eye exam:01/25/19  - Contraception: post menopausal     RTC 3 mo with Dr Ilsa Iha

## 2019-04-05 MED ORDER — ENBREL SURECLICK 50 MG/ML (1 ML) SUBCUTANEOUS PEN INJECTOR
SUBCUTANEOUS | 3 refills | 84 days | Status: CP
Start: 2019-04-05 — End: ?

## 2019-04-05 NOTE — Unmapped (Signed)
PATIENT MUST FILL ENBREL AT [facility].

## 2019-04-14 MED ORDER — METHOTREXATE SODIUM 2.5 MG TABLET
ORAL_TABLET | ORAL | 0 refills | 84 days | Status: CP
Start: 2019-04-14 — End: ?

## 2019-04-28 ENCOUNTER — Other Ambulatory Visit: Payer: Self-pay | Admitting: Otolaryngology

## 2019-04-28 DIAGNOSIS — H9202 Otalgia, left ear: Secondary | ICD-10-CM

## 2019-04-28 DIAGNOSIS — J358 Other chronic diseases of tonsils and adenoids: Secondary | ICD-10-CM

## 2019-04-28 DIAGNOSIS — J029 Acute pharyngitis, unspecified: Secondary | ICD-10-CM

## 2019-05-06 ENCOUNTER — Ambulatory Visit
Admission: RE | Admit: 2019-05-06 | Discharge: 2019-05-06 | Disposition: A | Discharge status: Home / Self Care | Payer: 59 | Source: Ambulatory Visit | Attending: Otolaryngology | Admitting: Otolaryngology

## 2019-05-06 DIAGNOSIS — H9202 Otalgia, left ear: Secondary | ICD-10-CM

## 2019-05-06 DIAGNOSIS — J029 Acute pharyngitis, unspecified: Secondary | ICD-10-CM

## 2019-05-06 DIAGNOSIS — J358 Other chronic diseases of tonsils and adenoids: Secondary | ICD-10-CM

## 2019-05-06 MED ORDER — IOPAMIDOL (ISOVUE-300) INJECTION 61%
75.0000 mL | Freq: Once | INTRAVENOUS | Status: AC | PRN
  Administered 2019-05-06: 75 mL via INTRAVENOUS

## 2019-06-29 ENCOUNTER — Ambulatory Visit: Admit: 2019-06-29 | Discharge: 2019-06-29 | Payer: PRIVATE HEALTH INSURANCE

## 2019-06-29 ENCOUNTER — Institutional Professional Consult (permissible substitution): Admit: 2019-06-29 | Discharge: 2019-06-29 | Payer: PRIVATE HEALTH INSURANCE | Attending: Family | Primary: Family

## 2019-06-29 DIAGNOSIS — Z79899 Other long term (current) drug therapy: Principal | ICD-10-CM

## 2019-06-29 DIAGNOSIS — M059 Rheumatoid arthritis with rheumatoid factor, unspecified: Principal | ICD-10-CM

## 2019-06-29 MED ORDER — PREDNISONE 1 MG TABLET: tablet | 1 refills | 0 days | Status: AC

## 2019-07-02 DIAGNOSIS — M059 Rheumatoid arthritis with rheumatoid factor, unspecified: Principal | ICD-10-CM

## 2019-07-05 DIAGNOSIS — M059 Rheumatoid arthritis with rheumatoid factor, unspecified: Principal | ICD-10-CM

## 2019-07-05 MED ORDER — METHOTREXATE SODIUM 2.5 MG TABLET: tablet | 3 refills | 0 days | Status: AC

## 2019-07-05 MED ORDER — FOLIC ACID 1 MG TABLET: 1 mg | tablet | Freq: Every day | 3 refills | 90 days | Status: AC

## 2019-07-06 DIAGNOSIS — M059 Rheumatoid arthritis with rheumatoid factor, unspecified: Principal | ICD-10-CM

## 2019-08-05 DIAGNOSIS — M059 Rheumatoid arthritis with rheumatoid factor, unspecified: Principal | ICD-10-CM

## 2019-08-31 DIAGNOSIS — M059 Rheumatoid arthritis with rheumatoid factor, unspecified: Principal | ICD-10-CM

## 2019-09-01 DIAGNOSIS — M059 Rheumatoid arthritis with rheumatoid factor, unspecified: Principal | ICD-10-CM

## 2019-09-01 MED ORDER — PREDNISONE 1 MG TABLET
ORAL_TABLET | Freq: Every day | ORAL | 0 refills | 90.00000 days | Status: CP
Start: 2019-09-01 — End: 2019-11-30

## 2019-09-08 IMAGING — CR DG CHEST 2V
2 series · 2 of 2 positions shown · non-contrast
Comparison: PA and lateral chest x-ray December 03, 2011

CLINICAL DATA: High risk medication use.

EXAM:
CHEST - 2 VIEW

[w chest pa]
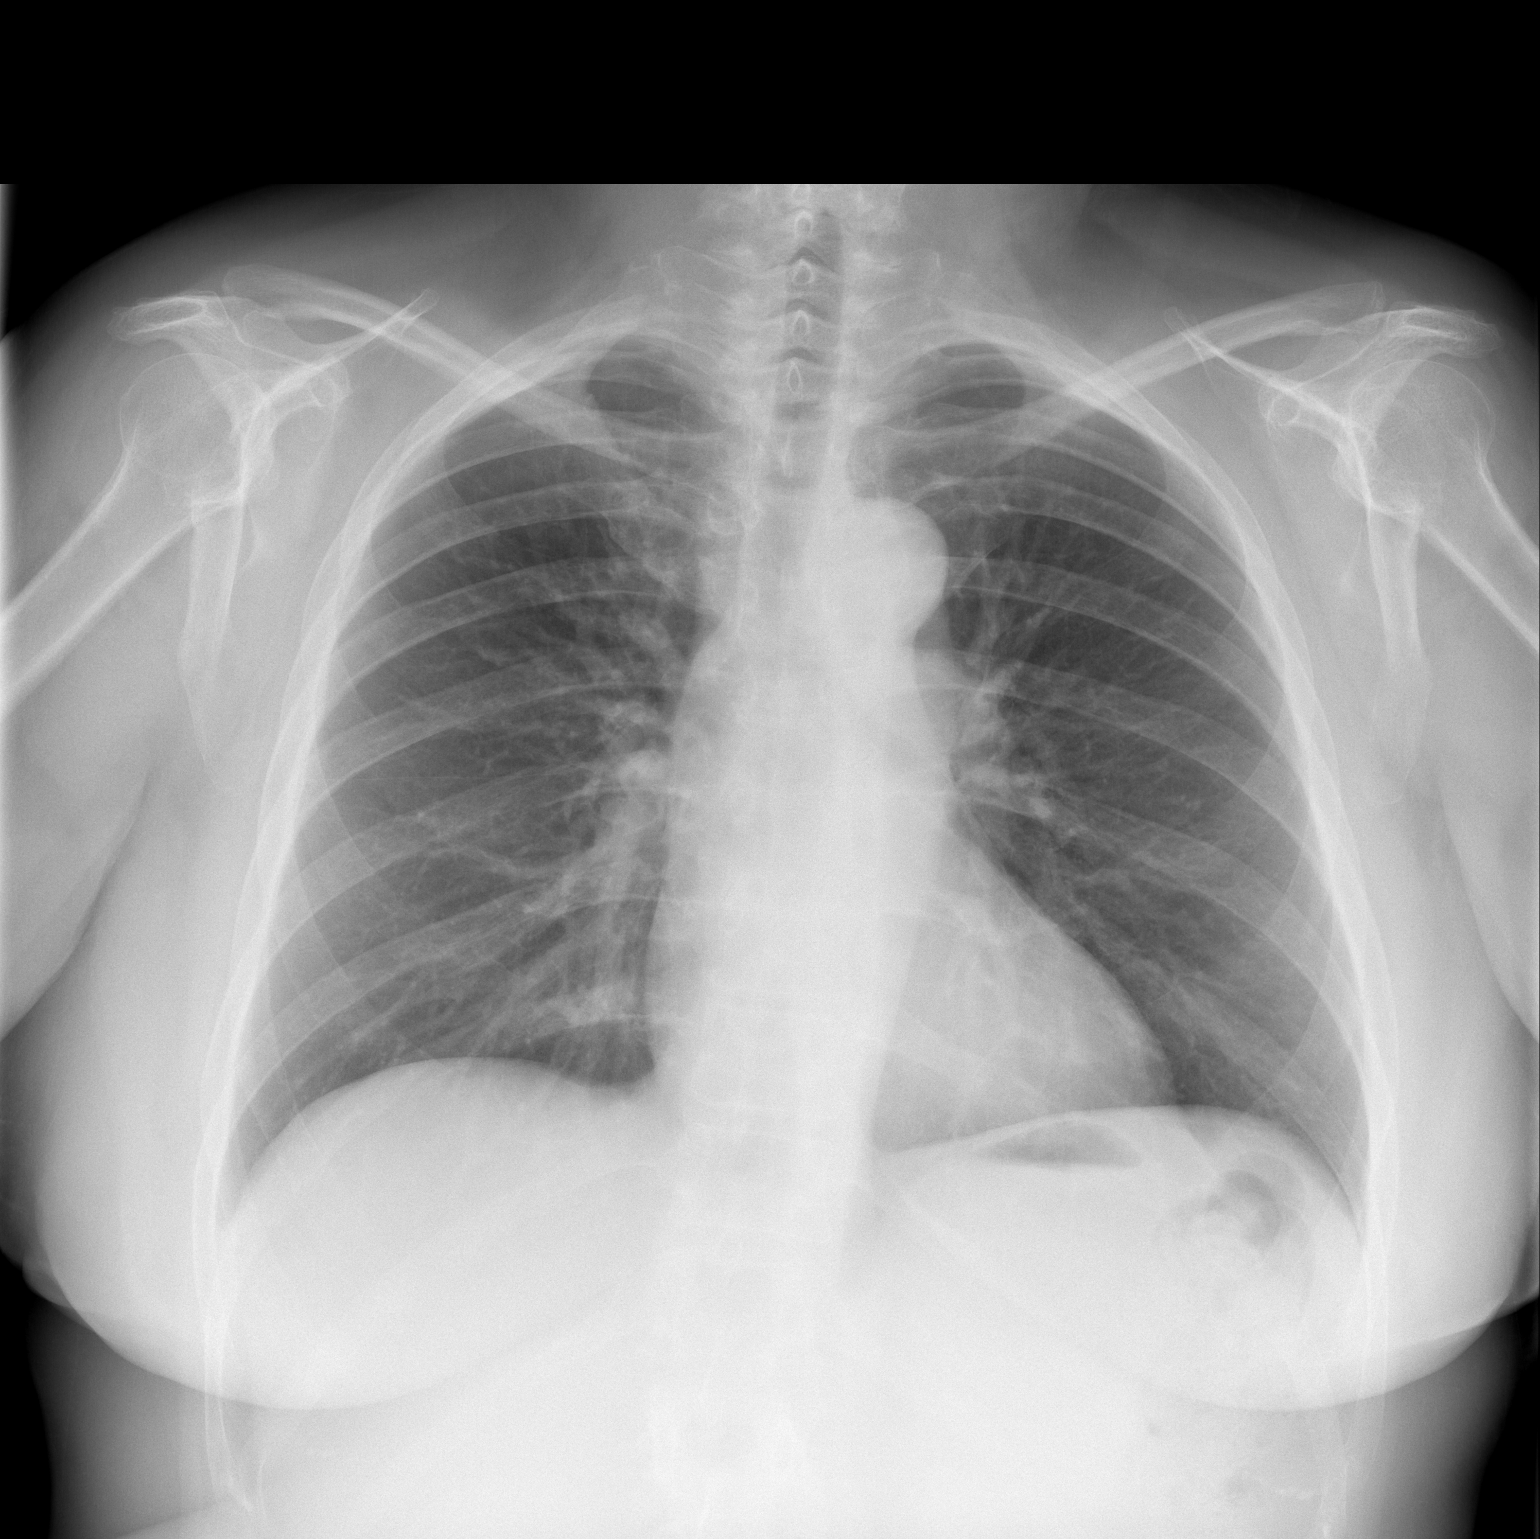

[w chest lat]
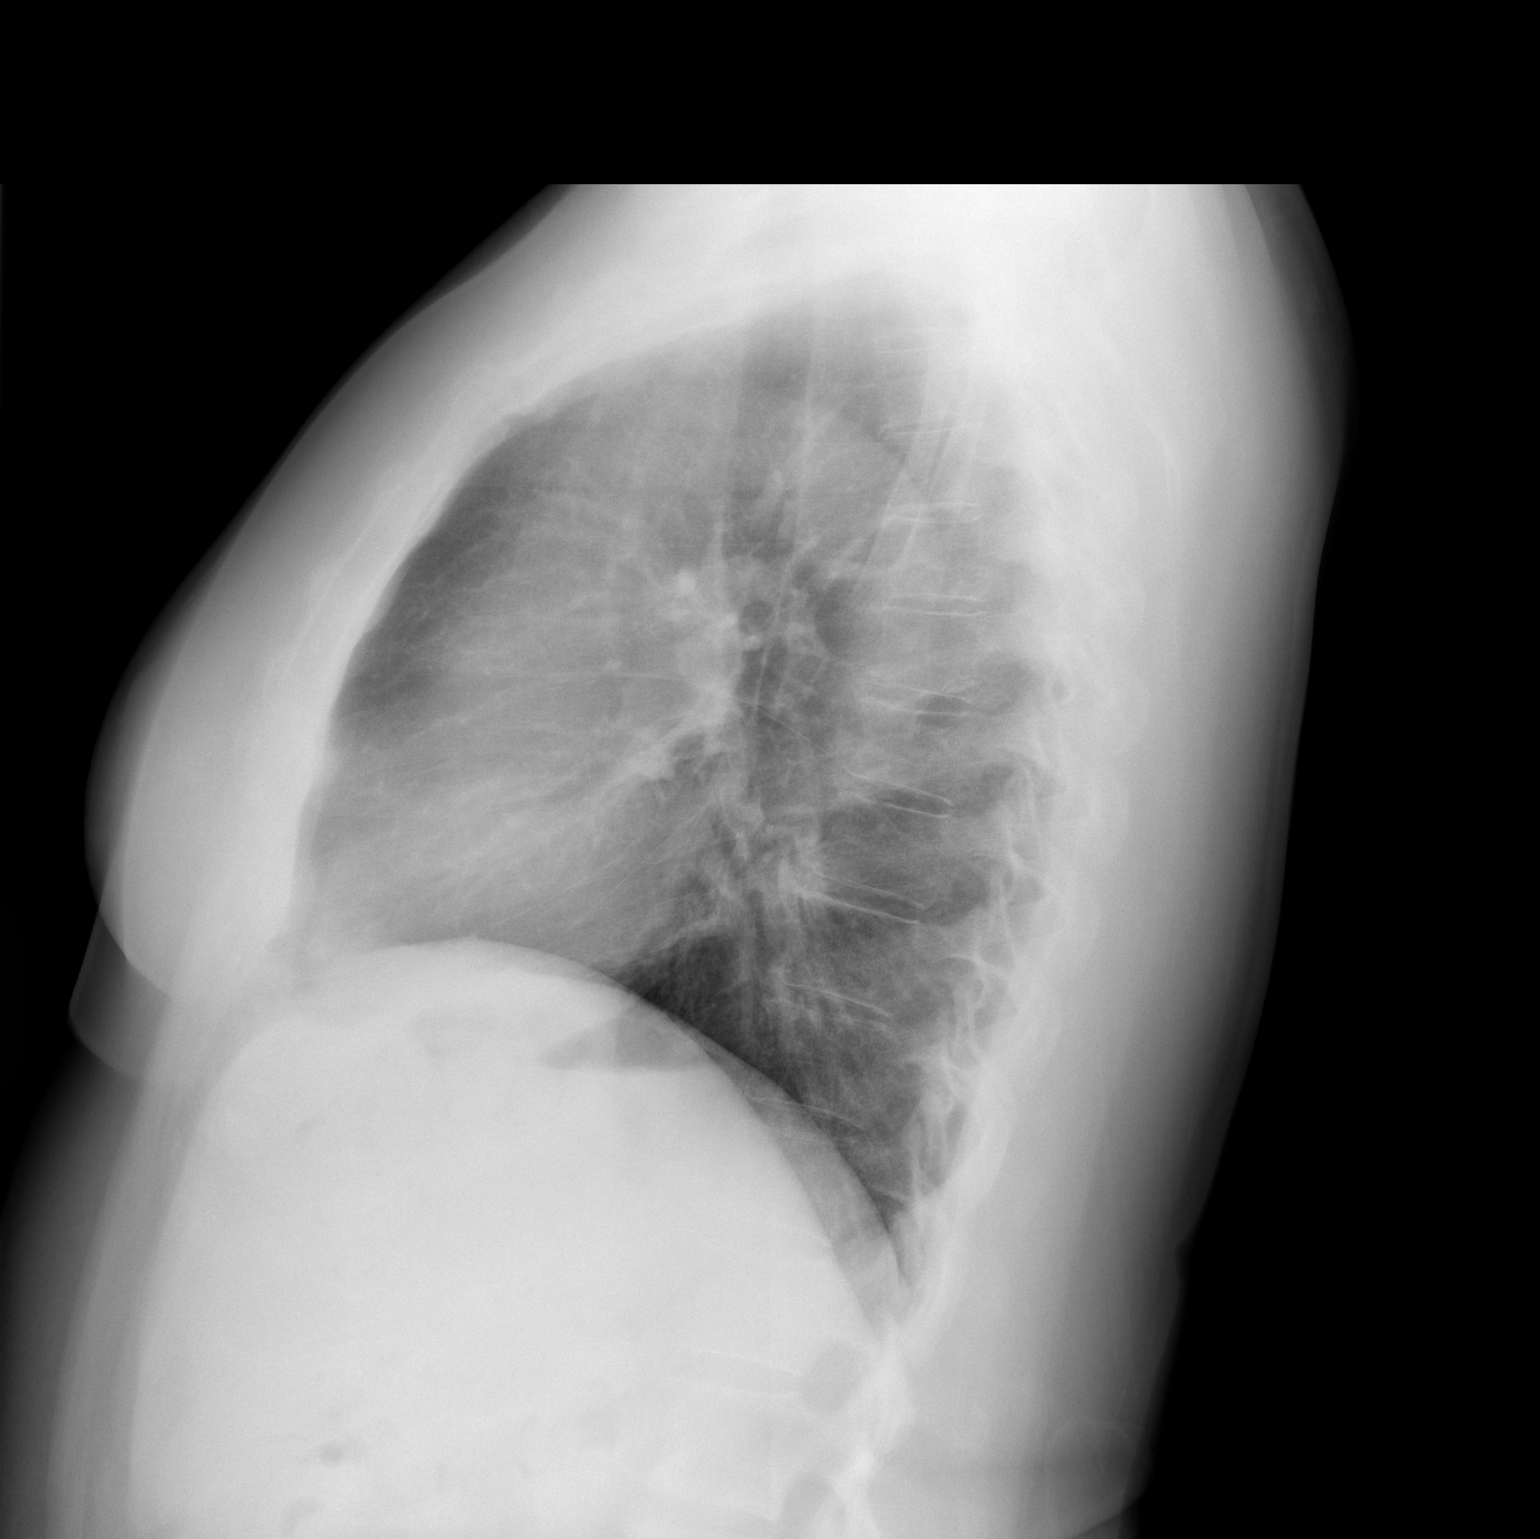

[2 of 2 positions shown; findings below may reference images not displayed]

FINDINGS: The lungs are adequately inflated and clear. The heart and pulmonary
vascularity are normal. The mediastinum is normal in width. There is
no pleural effusion. The bony thorax exhibits no acute abnormality.
IMPRESSION: There is no active cardiopulmonary disease.

## 2019-11-22 ENCOUNTER — Telehealth: Admit: 2019-11-22 | Discharge: 2019-11-23

## 2019-11-22 MED ORDER — PREDNISONE 1 MG TABLET
ORAL_TABLET | Freq: Every day | ORAL | 0 refills | 90.00000 days | Status: CP
Start: 2019-11-22 — End: 2020-02-20

## 2019-11-22 MED ORDER — METHOTREXATE SODIUM 2.5 MG TABLET
ORAL_TABLET | ORAL | 0 refills | 35.00000 days | Status: CP
Start: 2019-11-22 — End: ?

## 2019-11-25 DIAGNOSIS — M059 Rheumatoid arthritis with rheumatoid factor, unspecified: Principal | ICD-10-CM

## 2019-11-25 MED ORDER — PREDNISONE 1 MG TABLET
ORAL_TABLET | 1 refills | 0 days | Status: CP
Start: 2019-11-25 — End: ?

## 2019-12-05 ENCOUNTER — Ambulatory Visit: Admit: 2019-12-05 | Discharge: 2019-12-06 | Payer: PRIVATE HEALTH INSURANCE

## 2019-12-05 DIAGNOSIS — Z79899 Other long term (current) drug therapy: Principal | ICD-10-CM

## 2019-12-12 MED ORDER — NAPROXEN SODIUM 220 MG CAPSULE
0 days
Start: 2019-12-12 — End: ?

## 2019-12-29 ENCOUNTER — Ambulatory Visit: Admit: 2019-12-29 | Discharge: 2019-12-29 | Payer: PRIVATE HEALTH INSURANCE

## 2019-12-29 ENCOUNTER — Ambulatory Visit: Admit: 2019-12-29 | Discharge: 2019-12-29 | Payer: PRIVATE HEALTH INSURANCE | Attending: Family | Primary: Family

## 2019-12-29 DIAGNOSIS — M059 Rheumatoid arthritis with rheumatoid factor, unspecified: Principal | ICD-10-CM

## 2019-12-29 MED ORDER — METHOTREXATE SODIUM 2.5 MG TABLET
ORAL_TABLET | ORAL | 0 refills | 84.00000 days | Status: CP
Start: 2019-12-29 — End: 2020-03-28

## 2020-01-11 DIAGNOSIS — M059 Rheumatoid arthritis with rheumatoid factor, unspecified: Principal | ICD-10-CM

## 2020-01-12 MED ORDER — ENBREL SURECLICK 50 MG/ML (1 ML) SUBCUTANEOUS PEN INJECTOR
4 refills | 0 days | Status: CP
Start: 2020-01-12 — End: ?

## 2020-01-30 MED ORDER — METHOTREXATE SODIUM 2.5 MG TABLET
ORAL_TABLET | ORAL | 1 refills | 84 days | Status: CP
Start: 2020-01-30 — End: 2020-04-29

## 2020-02-01 DIAGNOSIS — M059 Rheumatoid arthritis with rheumatoid factor, unspecified: Principal | ICD-10-CM

## 2020-02-01 MED ORDER — METHOTREXATE SODIUM 2.5 MG TABLET
ORAL_TABLET | ORAL | 1 refills | 84 days
Start: 2020-02-01 — End: 2020-05-01

## 2020-03-26 DIAGNOSIS — M059 Rheumatoid arthritis with rheumatoid factor, unspecified: Principal | ICD-10-CM

## 2020-03-26 MED ORDER — METHOTREXATE SODIUM 2.5 MG TABLET
ORAL_TABLET | 1 refills | 0 days
Start: 2020-03-26 — End: ?

## 2020-04-09 DIAGNOSIS — M059 Rheumatoid arthritis with rheumatoid factor, unspecified: Principal | ICD-10-CM

## 2020-04-09 MED ORDER — PREDNISONE 1 MG TABLET
ORAL_TABLET | 0 refills | 0 days
Start: 2020-04-09 — End: ?

## 2020-04-23 ENCOUNTER — Other Ambulatory Visit: Payer: Self-pay

## 2020-04-23 ENCOUNTER — Ambulatory Visit
Admission: RE | Admit: 2020-04-23 | Discharge: 2020-04-23 | Disposition: A | Discharge status: Home / Self Care | Payer: No Typology Code available for payment source | Source: Ambulatory Visit | Attending: Family Medicine | Admitting: Family Medicine

## 2020-04-23 ENCOUNTER — Other Ambulatory Visit: Payer: Self-pay | Admitting: Family Medicine

## 2020-04-23 DIAGNOSIS — R059 Cough, unspecified: Secondary | ICD-10-CM

## 2020-05-05 DIAGNOSIS — M059 Rheumatoid arthritis with rheumatoid factor, unspecified: Principal | ICD-10-CM

## 2020-05-05 MED ORDER — METHOTREXATE SODIUM 2.5 MG TABLET
ORAL_TABLET | 1 refills | 0 days
Start: 2020-05-05 — End: ?

## 2020-05-11 MED ORDER — HYDROXYCHLOROQUINE 200 MG TABLET
ORAL_TABLET | Freq: Two times a day (BID) | ORAL | 11 refills | 30.00000 days | Status: CP
Start: 2020-05-11 — End: ?

## 2020-05-23 ENCOUNTER — Other Ambulatory Visit: Payer: Self-pay | Admitting: Family Medicine

## 2020-05-23 DIAGNOSIS — R0789 Other chest pain: Secondary | ICD-10-CM

## 2020-05-23 DIAGNOSIS — R0602 Shortness of breath: Secondary | ICD-10-CM

## 2020-05-25 ENCOUNTER — Other Ambulatory Visit: Payer: Self-pay | Admitting: Family Medicine

## 2020-05-30 ENCOUNTER — Ambulatory Visit: Admit: 2020-05-30 | Discharge: 2020-05-31 | Payer: PRIVATE HEALTH INSURANCE

## 2020-05-30 ENCOUNTER — Ambulatory Visit
Admission: RE | Admit: 2020-05-30 | Discharge: 2020-05-30 | Disposition: A | Discharge status: Home / Self Care | Payer: No Typology Code available for payment source | Source: Ambulatory Visit | Attending: Family Medicine | Admitting: Family Medicine

## 2020-05-30 DIAGNOSIS — M059 Rheumatoid arthritis with rheumatoid factor, unspecified: Principal | ICD-10-CM

## 2020-05-30 DIAGNOSIS — M7022 Olecranon bursitis, left elbow: Principal | ICD-10-CM

## 2020-05-30 DIAGNOSIS — Z79899 Other long term (current) drug therapy: Principal | ICD-10-CM

## 2020-05-30 DIAGNOSIS — R0789 Other chest pain: Secondary | ICD-10-CM

## 2020-05-30 DIAGNOSIS — R0602 Shortness of breath: Secondary | ICD-10-CM

## 2020-05-30 MED ORDER — ENBREL SURECLICK 50 MG/ML (1 ML) SUBCUTANEOUS PEN INJECTOR
SUBCUTANEOUS | 11 refills | 28.00000 days | Status: CP
Start: 2020-05-30 — End: ?

## 2020-06-17 DIAGNOSIS — M059 Rheumatoid arthritis with rheumatoid factor, unspecified: Principal | ICD-10-CM

## 2020-06-17 MED ORDER — PREDNISONE 1 MG TABLET
ORAL_TABLET | 1 refills | 0.00000 days
Start: 2020-06-17 — End: ?

## 2020-06-19 MED ORDER — PREDNISONE 1 MG TABLET
ORAL_TABLET | 1 refills | 0.00000 days
Start: 2020-06-19 — End: ?

## 2020-06-21 MED ORDER — PREDNISONE 1 MG TABLET
ORAL_TABLET | 1 refills | 0 days
Start: 2020-06-21 — End: ?

## 2020-06-21 MED ORDER — FOLIC ACID 1 MG TABLET
ORAL_TABLET | Freq: Every day | ORAL | 3 refills | 90.00000 days | Status: CP
Start: 2020-06-21 — End: 2021-06-21

## 2020-06-21 MED ORDER — METHOTREXATE SODIUM 2.5 MG TABLET
ORAL_TABLET | ORAL | 0 refills | 84.00000 days | Status: CP
Start: 2020-06-21 — End: ?

## 2020-06-22 MED ORDER — PREDNISONE 1 MG TABLET
ORAL_TABLET | 1 refills | 0.00000 days
Start: 2020-06-22 — End: ?

## 2020-07-26 DIAGNOSIS — M059 Rheumatoid arthritis with rheumatoid factor, unspecified: Principal | ICD-10-CM

## 2020-07-30 DIAGNOSIS — M059 Rheumatoid arthritis with rheumatoid factor, unspecified: Principal | ICD-10-CM

## 2020-08-15 ENCOUNTER — Other Ambulatory Visit: Payer: Self-pay

## 2020-08-15 ENCOUNTER — Encounter: Payer: Self-pay | Admitting: Pulmonary Disease

## 2020-08-15 ENCOUNTER — Ambulatory Visit (INDEPENDENT_AMBULATORY_CARE_PROVIDER_SITE_OTHER): Payer: No Typology Code available for payment source | Admitting: Pulmonary Disease

## 2020-08-15 VITALS — BP 118/78 | HR 71 | Temp 97.7°F | Ht 65.0 in | Wt 194.6 lb

## 2020-08-15 DIAGNOSIS — R0602 Shortness of breath: Secondary | ICD-10-CM | POA: Diagnosis not present

## 2020-08-15 NOTE — Patient Instructions (Signed)
Improving cough Pleuritic symptoms History of rheumatoid arthritis  Possible interstitial lung disease related to rheumatoid arthritis  We will obtain high-resolution CT scan of the chest Obtain breathing study  I will see you back in 4 weeks -PFT can be performed on the day of next visit  Call with significant concerns

## 2020-08-15 NOTE — Progress Notes (Signed)
Carla Holloway    [id number]    [birth year]  Primary Care Physician:Harris, Gwyndolyn Saxon, MD  Referring Physician: Shirline Frees, MD [place],  [place] [address]  Chief complaint:  Patient being seen for pleuritic chest pain, informed about atelectasis on CT  HPI:  Patient with a history of rheumatoid arthritis, diagnosed about 3 years ago Has been on Enbrel, methotrexate, Plaquenil  Has been having symptoms of chest discomfort whenever she sneezes or cough She had a CT scan done in September that showed groundglass changes at the base and a lung nodule She was not sick at the time the CT was performed Has no history suggesting aspiration Worked in nursing a knee   Never smoker  No family history of lung disease   Outpatient Encounter Medications as of 08/15/2020  Medication Sig  . cephALEXin (KEFLEX) 500 MG capsule Take 500 mg by mouth 4 (four) times daily.  Marland Kitchen etanercept (ENBREL SURECLICK) 50 MG/ML injection Inject into the skin.  . famotidine-calcium carbonate-magnesium hydroxide (PEPCID COMPLETE) 10-800-165 MG CHEW Chew 1 tablet by mouth daily as needed.  Marland Kitchen Fexofenadine HCl (ALLEGRA PO) Take 1 tablet by mouth daily.  . folic acid (FOLVITE) 1 MG tablet Take by mouth.  . hydroxychloroquine (PLAQUENIL) 200 MG tablet Take 200 mg by mouth 2 (two) times daily.  . lansoprazole (PREVACID) 30 MG capsule Take 30 mg by mouth daily.  Marland Kitchen levonorgestrel-ethinyl estradiol (SEASONALE,INTROVALE,JOLESSA) 0.15-0.03 MG tablet Take 1 tablet by mouth daily.  Marland Kitchen levothyroxine (SYNTHROID, LEVOTHROID) 100 MCG tablet Take 100 mcg by mouth daily.  . Multiple Vitamin (MULTIVITAMIN) tablet Take 1 tablet by mouth daily.  . Omega-3 Fatty Acids (FISH OIL PO) Take 1 capsule by mouth daily.  . predniSONE (DELTASONE) 1 MG tablet START AT 4 MG DAILY AND TAPER DOWN BY 1 MG EVERY MONTH AS TOLERATED   No facility-administered encounter medications on file as of 08/15/2020.     Allergies as of 08/15/2020 - Review Complete 08/15/2020  Allergen Reaction Noted  . Atorvastatin Nausea Only, Nausea And Vomiting, and Other (See Comments) 08/15/2020  . Topiramate Other (See Comments) 07/31/2014  . Codeine Nausea And Vomiting and Nausea Only 07/31/2014  . Tramadol Nausea Only 08/15/2020    Past Medical History:  Diagnosis Date  . Melanoma ([place])   . Thyroid disease     Past Surgical History:  Procedure Laterality Date  . BREAST REDUCTION SURGERY    . melanoma removal     back     No family history on file.  Social History   Socioeconomic History  . Marital status: Married    Spouse name: Not on file  . Number of children: Not on file  . Years of education: Not on file  . Highest education level: Not on file  Occupational History  . Not on file  Tobacco Use  . Smoking status: Never Smoker  . Smokeless tobacco: Never Used  Substance and Sexual Activity  . Alcohol use: Yes    Comment: occassionally  . Drug use: No  . Sexual activity: Yes    Birth control/protection: Pill  Other Topics Concern  . Not on file  Social History Narrative  . Not on file   Social Determinants of Health   Financial Resource Strain:   . Difficulty of Paying Living Expenses: Not on file  Food Insecurity:   . Worried About Charity fundraiser in the Last Year: Not on file  .  Ran Out of Food in the Last Year: Not on file  Transportation Needs:   . Lack of Transportation (Medical): Not on file  . Lack of Transportation (Non-Medical): Not on file  Physical Activity:   . Days of Exercise per Week: Not on file  . Minutes of Exercise per Session: Not on file  Stress:   . Feeling of Stress : Not on file  Social Connections:   . Frequency of Communication with Friends and Family: Not on file  . Frequency of Social Gatherings with Friends and Family: Not on file  . Attends Religious Services: Not on file  . Active Member of Clubs or Organizations: Not on file  .  Attends Archivist Meetings: Not on file  . Marital Status: Not on file  Intimate Partner Violence:   . Fear of Current or Ex-Partner: Not on file  . Emotionally Abused: Not on file  . Physically Abused: Not on file  . Sexually Abused: Not on file    Review of Systems  Constitutional: Negative.  Negative for fatigue.  Respiratory: Positive for cough. Negative for shortness of breath.   Cardiovascular: Positive for chest pain.    Vitals:   08/15/20 1450  BP: 118/78  Pulse: 71  Temp: 97.7 F (36.5 C)  SpO2: 95%     Physical Exam Constitutional:      Appearance: She is obese.  HENT:     Nose: No congestion.     Mouth/Throat:     Mouth: Mucous membranes are moist.  Eyes:     General:        Right eye: No discharge.        Left eye: No discharge.  Cardiovascular:     Rate and Rhythm: Normal rate and regular rhythm.     Heart sounds: No murmur heard.  No friction rub.  Pulmonary:     Effort: Pulmonary effort is normal. No respiratory distress.     Breath sounds: Normal breath sounds. No stridor. No wheezing or rhonchi.  Musculoskeletal:     Cervical back: No rigidity or tenderness.  Neurological:     Mental Status: She is alert.    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  Follow-up in 4 weeks  Sherrilyn Rist MD [facility] 08/15/2020, 3:13 PM  CC: Shirline Frees, MD

## 2020-09-04 ENCOUNTER — Other Ambulatory Visit: Payer: No Typology Code available for payment source

## 2020-09-04 ENCOUNTER — Telehealth: Payer: Self-pay | Admitting: Nurse Practitioner

## 2020-09-04 DIAGNOSIS — B342 Coronavirus infection, unspecified: Principal | ICD-10-CM

## 2020-09-04 DIAGNOSIS — M059 Rheumatoid arthritis with rheumatoid factor, unspecified: Principal | ICD-10-CM

## 2020-09-04 NOTE — Telephone Encounter (Signed)
Called to Discuss with patient about Covid symptoms and the use of the monoclonal antibody infusion for those with mild to moderate Covid symptoms and at a high risk of hospitalization.     Pt appears to qualify for this infusion due to co-morbid conditions and/or a member of an at-risk group in accordance with the FDA Emergency Use Authorization.    Unable to reach pt- left voicemail message on mobile phone number requesting return call.

## 2020-09-05 ENCOUNTER — Telehealth (HOSPITAL_COMMUNITY): Payer: Self-pay | Admitting: Emergency Medicine

## 2020-09-05 NOTE — Telephone Encounter (Signed)
Called pt and explained possible monoclonal antibody treatment. Pt received second [place] vaccination April 2021 and booster June 2021. Therefore, she does not qualify for mAb tx at this time.

## 2020-09-18 ENCOUNTER — Ambulatory Visit
Admission: RE | Admit: 2020-09-18 | Discharge: 2020-09-18 | Disposition: A | Discharge status: Home / Self Care | Payer: No Typology Code available for payment source | Source: Ambulatory Visit | Attending: Pulmonary Disease | Admitting: Pulmonary Disease

## 2020-09-18 DIAGNOSIS — R0602 Shortness of breath: Secondary | ICD-10-CM

## 2020-09-19 ENCOUNTER — Ambulatory Visit: Admit: 2020-09-19 | Discharge: 2020-09-20 | Payer: PRIVATE HEALTH INSURANCE | Attending: Family | Primary: Family

## 2020-09-19 DIAGNOSIS — M059 Rheumatoid arthritis with rheumatoid factor, unspecified: Principal | ICD-10-CM

## 2020-09-19 DIAGNOSIS — Z79899 Other long term (current) drug therapy: Principal | ICD-10-CM

## 2020-09-19 MED ORDER — PREDNISONE 1 MG TABLET
ORAL_TABLET | Freq: Every day | ORAL | 0 refills | 60.00000 days | Status: CP
Start: 2020-09-19 — End: 2020-11-06

## 2020-10-09 ENCOUNTER — Other Ambulatory Visit (HOSPITAL_COMMUNITY)
Admission: RE | Admit: 2020-10-09 | Discharge: 2020-10-09 | Disposition: A | Discharge status: Home / Self Care | Payer: No Typology Code available for payment source | Source: Ambulatory Visit | Attending: Pulmonary Disease | Admitting: Pulmonary Disease

## 2020-10-09 DIAGNOSIS — Z20822 Contact with and (suspected) exposure to covid-19: Secondary | ICD-10-CM | POA: Insufficient documentation

## 2020-10-09 DIAGNOSIS — Z01812 Encounter for preprocedural laboratory examination: Secondary | ICD-10-CM | POA: Insufficient documentation

## 2020-10-10 LAB — SARS CORONAVIRUS 2 (TAT 6-24 HRS): SARS Coronavirus 2: NEGATIVE

## 2020-10-11 ENCOUNTER — Ambulatory Visit (INDEPENDENT_AMBULATORY_CARE_PROVIDER_SITE_OTHER): Payer: No Typology Code available for payment source | Admitting: Pulmonary Disease

## 2020-10-11 ENCOUNTER — Other Ambulatory Visit: Payer: Self-pay

## 2020-10-11 ENCOUNTER — Encounter: Payer: Self-pay | Admitting: Pulmonary Disease

## 2020-10-11 VITALS — BP 124/78 | HR 67 | Temp 97.4°F | Ht 65.5 in | Wt 197.0 lb

## 2020-10-11 DIAGNOSIS — R0602 Shortness of breath: Secondary | ICD-10-CM

## 2020-10-11 LAB — PULMONARY FUNCTION TEST
DL/VA % pred: 109 %
DL/VA: 4.59 ml/min/mmHg/L
DLCO cor % pred: 117 %
DLCO cor: 24.63 ml/min/mmHg
DLCO unc % pred: 117 %
DLCO unc: 24.63 ml/min/mmHg
FEF 25-75 Post: 3.85 L/sec
FEF 25-75 Pre: 2.82 L/sec
FEF2575-%Change-Post: 36 %
FEF2575-%Pred-Post: 157 %
FEF2575-%Pred-Pre: 115 %
FEV1-%Change-Post: 8 %
FEV1-%Pred-Post: 120 %
FEV1-%Pred-Pre: 111 %
FEV1-Post: 3.22 L
FEV1-Pre: 2.98 L
FEV1FVC-%Change-Post: 5 %
FEV1FVC-%Pred-Pre: 101 %
FEV6-%Change-Post: 2 %
FEV6-%Pred-Post: 115 %
FEV6-%Pred-Pre: 112 %
FEV6-Post: 3.84 L
FEV6-Pre: 3.74 L
FEV6FVC-%Pred-Post: 103 %
FEV6FVC-%Pred-Pre: 103 %
FVC-%Change-Post: 2 %
FVC-%Pred-Post: 111 %
FVC-%Pred-Pre: 108 %
FVC-Post: 3.84 L
FVC-Pre: 3.74 L
Post FEV1/FVC ratio: 84 %
Post FEV6/FVC ratio: 100 %
Pre FEV1/FVC ratio: 80 %
Pre FEV6/FVC Ratio: 100 %
RV % pred: 76 %
RV: 1.55 L
TLC % pred: 103 %
TLC: 5.38 L

## 2020-10-11 NOTE — Progress Notes (Signed)
Carla Holloway    [id number]    Aug 29, 1961  Primary Care Physician:Harris, Gwyndolyn Saxon, MD  Referring Physician: Shirline Frees, MD [place],  [place] [address]  Chief complaint: Patient being seen for pleuritic chest pain, informed about atelectasis on CT Denies any ongoing symptoms at present  HPI:  Patient with a history of rheumatoid arthritis, diagnosed about 3 years ago Has been on Enbrel, methotrexate, Plaquenil  Recently had an intervention during which she had some desaturation events Has remained relatively stable  She had a CT scan done in September that showed groundglass changes at the base and a lung nodule She was not sick at the time the CT was performed Has no history suggesting aspiration Worked in nursing a knee   Never smoker  No family history of lung disease  Since the last visit had a CT scan performed and a PFT   Outpatient Encounter Medications as of 10/11/2020  Medication Sig  . cephALEXin (KEFLEX) 500 MG capsule Take 500 mg by mouth 4 (four) times daily.  Marland Kitchen etanercept (ENBREL SURECLICK) 50 MG/ML injection Inject into the skin.  . famotidine-calcium carbonate-magnesium hydroxide (PEPCID COMPLETE) 10-800-165 MG CHEW Chew 1 tablet by mouth daily as needed.  Marland Kitchen Fexofenadine HCl (ALLEGRA PO) Take 1 tablet by mouth daily.  . folic acid (FOLVITE) 1 MG tablet Take by mouth.  . hydroxychloroquine (PLAQUENIL) 200 MG tablet Take 200 mg by mouth 2 (two) times daily.  . lansoprazole (PREVACID) 30 MG capsule Take 30 mg by mouth daily.  Marland Kitchen levonorgestrel-ethinyl estradiol (SEASONALE,INTROVALE,JOLESSA) 0.15-0.03 MG tablet Take 1 tablet by mouth daily.  Marland Kitchen levothyroxine (SYNTHROID, LEVOTHROID) 100 MCG tablet Take 100 mcg by mouth daily.  . Multiple Vitamin (MULTIVITAMIN) tablet Take 1 tablet by mouth daily.  . Omega-3 Fatty Acids (FISH OIL PO) Take 1 capsule by mouth daily.  . predniSONE (DELTASONE) 1 MG tablet START AT 4 MG  DAILY AND TAPER DOWN BY 1 MG EVERY MONTH AS TOLERATED   No facility-administered encounter medications on file as of 10/11/2020.    Allergies as of 10/11/2020 - Review Complete 10/11/2020  Allergen Reaction Noted  . Atorvastatin Nausea Only, Nausea And Vomiting, and Other (See Comments) 08/15/2020  . Topiramate Other (See Comments) 07/31/2014  . Codeine Nausea And Vomiting and Nausea Only 07/31/2014  . Tramadol Nausea Only 08/15/2020    Past Medical History:  Diagnosis Date  . Melanoma ([place])   . Thyroid disease     Past Surgical History:  Procedure Laterality Date  . BREAST REDUCTION SURGERY    . melanoma removal     back     No family history on file.  Social History   Socioeconomic History  . Marital status: Married    Spouse name: Not on file  . Number of children: Not on file  . Years of education: Not on file  . Highest education level: Not on file  Occupational History  . Not on file  Tobacco Use  . Smoking status: Never Smoker  . Smokeless tobacco: Never Used  Substance and Sexual Activity  . Alcohol use: Yes    Comment: occassionally  . Drug use: No  . Sexual activity: Yes    Birth control/protection: Pill  Other Topics Concern  . Not on file  Social History Narrative  . Not on file   Social Determinants of Health   Financial Resource Strain: Not on file  Food Insecurity: Not on  file  Transportation Needs: Not on file  Physical Activity: Not on file  Stress: Not on file  Social Connections: Not on file  Intimate Partner Violence: Not on file    Review of Systems  Constitutional: Negative.  Negative for fatigue.  Respiratory: Negative for cough and shortness of breath.   Cardiovascular: Negative for chest pain.    Vitals:   10/11/20 1158  BP: 124/78  Pulse: 67  Temp: (!) 97.4 F (36.3 C)  SpO2: 96%     Physical Exam Constitutional:      Appearance: She is obese.  HENT:     Head: Normocephalic and atraumatic.     Nose: No  congestion.     Mouth/Throat:     Mouth: Mucous membranes are moist.  Eyes:     General:        Right eye: No discharge.        Left eye: No discharge.  Cardiovascular:     Rate and Rhythm: Normal rate and regular rhythm.     Heart sounds: No murmur heard. No friction rub.  Pulmonary:     Effort: Pulmonary effort is normal. No respiratory distress.     Breath sounds: Normal breath sounds. No stridor. No wheezing or rhonchi.  Musculoskeletal:     Cervical back: No rigidity or tenderness.  Neurological:     Mental Status: She is alert.    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 -Normal PFT, CT scan of chest showing groundglass changes, lung nodules  Semisolid nodule which was not present on previous CT, this is likely inflammatory -Repeat CT in 6 months  She is continuing to follow with rheumatology, on weaning doses of steroids  Scheduled for surgery for olecranon bursitis -PFT is normal -Has no ongoing symptoms at present -She should be low risk for surgery   Plan/Recommendations:  Management will still entail keeping the inflammatory process and check  Persistence of the groundglass changes although there is only 4 months in between the CTs  Semisolid nodule needs to be followed up by CT-repeat CT in 6 months  Repeat PFT in a year from now  Regarding scheduling for olecranon bursitis  surgery -Low risk for complications -Has no modifiable risk at present -She is cleared for surgery from a pulmonary perspective  Follow-up in 6 months  Sherrilyn Rist MD [facility] 10/11/2020, 12:11 PM  CC: Shirline Frees, MD

## 2020-10-11 NOTE — Patient Instructions (Signed)
Interstitial lung disease  Your PFT today was within normal limits CT scan did show interstitial changes and lung nodule  Repeat CT scan in 6 months Repeat PFT in 1 year  Continue ongoing treatment for rheumatoid arthritis  From a breathing perspective, you should be okay to go ahead with surgery  I will see you back in 6 months Call with significant concerns

## 2020-10-11 NOTE — Progress Notes (Signed)
PFT done today. 

## 2020-11-06 DIAGNOSIS — M059 Rheumatoid arthritis with rheumatoid factor, unspecified: Principal | ICD-10-CM

## 2020-11-06 MED ORDER — PREDNISONE 1 MG TABLET
ORAL_TABLET | Freq: Every day | ORAL | 1 refills | 30 days | Status: CP
Start: 2020-11-06 — End: 2020-12-06

## 2020-11-19 DIAGNOSIS — M059 Rheumatoid arthritis with rheumatoid factor, unspecified: Principal | ICD-10-CM

## 2020-11-19 MED ORDER — METHOTREXATE SODIUM 2.5 MG TABLET
ORAL_TABLET | ORAL | 0 refills | 84 days
Start: 2020-11-19 — End: ?

## 2020-11-20 MED ORDER — METHOTREXATE SODIUM 2.5 MG TABLET
ORAL_TABLET | ORAL | 0 refills | 84 days
Start: 2020-11-20 — End: ?

## 2020-11-21 MED ORDER — METHOTREXATE SODIUM 2.5 MG TABLET
ORAL_TABLET | ORAL | 0 refills | 84.00000 days | Status: CP
Start: 2020-11-21 — End: ?

## 2020-11-27 ENCOUNTER — Ambulatory Visit: Admit: 2020-11-27 | Discharge: 2020-11-28 | Payer: PRIVATE HEALTH INSURANCE

## 2020-11-27 DIAGNOSIS — M1990 Unspecified osteoarthritis, unspecified site: Principal | ICD-10-CM

## 2020-11-27 DIAGNOSIS — M7022 Olecranon bursitis, left elbow: Principal | ICD-10-CM

## 2020-11-27 DIAGNOSIS — M059 Rheumatoid arthritis with rheumatoid factor, unspecified: Principal | ICD-10-CM

## 2020-11-27 DIAGNOSIS — Z79899 Other long term (current) drug therapy: Principal | ICD-10-CM

## 2020-12-19 DIAGNOSIS — M059 Rheumatoid arthritis with rheumatoid factor, unspecified: Principal | ICD-10-CM

## 2020-12-19 MED ORDER — METHOTREXATE SODIUM 2.5 MG TABLET
ORAL_TABLET | ORAL | 1 refills | 0 days
Start: 2020-12-19 — End: ?

## 2021-01-15 ENCOUNTER — Telehealth: Payer: Self-pay | Admitting: Pulmonary Disease

## 2021-01-15 NOTE — Telephone Encounter (Signed)
[facility]   Pt has an order placed for a follow up CT scan and she is requesting that this be done the end of June.  Thanks

## 2021-01-16 NOTE — Telephone Encounter (Signed)
Called and sched CT for pt on 6/27 @ 4:30. Pt aware of appt. Nothing further needed.

## 2021-02-11 DIAGNOSIS — M059 Rheumatoid arthritis with rheumatoid factor, unspecified: Principal | ICD-10-CM

## 2021-02-11 MED ORDER — METHOTREXATE SODIUM 2.5 MG TABLET
ORAL_TABLET | ORAL | 2 refills | 0.00000 days
Start: 2021-02-11 — End: ?

## 2021-02-13 MED ORDER — PREDNISONE 1 MG TABLET
ORAL_TABLET | Freq: Every day | ORAL | 2 refills | 30.00000 days | Status: CP
Start: 2021-02-13 — End: 2021-02-13

## 2021-02-13 MED ORDER — METHOTREXATE SODIUM 2.5 MG TABLET
ORAL_TABLET | ORAL | 0 refills | 84 days | Status: CP
Start: 2021-02-13 — End: ?

## 2021-03-06 DIAGNOSIS — M059 Rheumatoid arthritis with rheumatoid factor, unspecified: Principal | ICD-10-CM

## 2021-03-06 MED ORDER — METHOTREXATE SODIUM 2.5 MG TABLET
ORAL_TABLET | 2 refills | 0 days
Start: 2021-03-06 — End: ?

## 2021-03-07 MED ORDER — METHOTREXATE SODIUM 2.5 MG TABLET
ORAL_TABLET | ORAL | 0 refills | 84 days | Status: CP
Start: 2021-03-07 — End: 2021-06-05

## 2021-03-08 ENCOUNTER — Other Ambulatory Visit: Payer: Self-pay | Admitting: Pediatrics

## 2021-03-11 ENCOUNTER — Other Ambulatory Visit: Payer: Self-pay

## 2021-03-11 ENCOUNTER — Ambulatory Visit
Admission: RE | Admit: 2021-03-11 | Discharge: 2021-03-11 | Disposition: A | Discharge status: Home / Self Care | Payer: No Typology Code available for payment source | Source: Ambulatory Visit | Attending: Pulmonary Disease | Admitting: Pulmonary Disease

## 2021-03-11 DIAGNOSIS — R0602 Shortness of breath: Secondary | ICD-10-CM

## 2021-03-15 ENCOUNTER — Ambulatory Visit (INDEPENDENT_AMBULATORY_CARE_PROVIDER_SITE_OTHER): Payer: No Typology Code available for payment source | Admitting: Pulmonary Disease

## 2021-03-15 ENCOUNTER — Other Ambulatory Visit: Payer: Self-pay

## 2021-03-15 ENCOUNTER — Encounter: Payer: Self-pay | Admitting: Pulmonary Disease

## 2021-03-15 VITALS — BP 130/72 | HR 70 | Ht 65.5 in | Wt 187.8 lb

## 2021-03-15 DIAGNOSIS — I712 Thoracic aortic aneurysm, without rupture: Secondary | ICD-10-CM

## 2021-03-15 DIAGNOSIS — I7121 Aneurysm of the ascending aorta, without rupture: Secondary | ICD-10-CM

## 2021-03-15 NOTE — Progress Notes (Signed)
Carla Holloway    [id number]    Jan 02, 1961  Primary Care Physician:Harris, Gwyndolyn Saxon, MD  Referring Physician: Shirline Frees, MD [place],  [place] [address]  Chief complaint: Follow-up for groundglass changes on CT Does have occasional chest discomfort  HPI:  Rheumatoid arthritis diagnosed about 3 Years ago On medications including Enbrel, methotrexate, Plaquenil  No significant other symptoms apart from an occasional cough, chest discomfort sometimes with deep breathing Denies any other significant symptoms are present No limitations with respect to activities of daily living  Repeat CT shows resolution of lung nodule, still shows some interstitial infiltrates  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   Fexofenadine HCl (ALLEGRA PO) Take 1 tablet by mouth daily.   folic acid (FOLVITE) 1 MG tablet Take by mouth.   hydroxychloroquine (PLAQUENIL) 200 MG tablet Take 200 mg by mouth 2 (two) times daily.   lansoprazole (PREVACID) 30 MG capsule Take 30 mg by mouth daily.   levothyroxine (SYNTHROID) 175 MCG tablet levothyroxine 175 mcg tablet  TAKE 1 TABLET BY MOUTH EVERY DAY   methotrexate (RHEUMATREX) 2.5 MG tablet Take 20 mg by mouth once a week.   Multiple Vitamin (MULTIVITAMIN) tablet Take 1 tablet by mouth daily.   Omega-3 Fatty Acids (FISH OIL PO) Take 1 capsule by mouth daily.   predniSONE 1 MG TBEC 2 mg.   [DISCONTINUED] cephALEXin (KEFLEX) 500 MG capsule Take 500 mg by mouth 4 (four) times daily.   [DISCONTINUED] famotidine-calcium carbonate-magnesium hydroxide (PEPCID COMPLETE) 10-800-165 MG CHEW Chew 1 tablet by mouth daily as needed.   [DISCONTINUED] levonorgestrel-ethinyl  estradiol (SEASONALE,INTROVALE,JOLESSA) 0.15-0.03 MG tablet Take 1 tablet by mouth daily.   [DISCONTINUED] levothyroxine (SYNTHROID, LEVOTHROID) 100 MCG tablet Take 100 mcg by mouth daily.   [DISCONTINUED] predniSONE (DELTASONE) 1 MG tablet START AT 4 MG DAILY AND TAPER DOWN BY 1 MG EVERY MONTH AS TOLERATED   No facility-administered encounter medications on file as of 03/15/2021.    Allergies as of 03/15/2021 - Review Complete 03/15/2021  Allergen Reaction Noted   Atorvastatin Nausea Only, Nausea And Vomiting, and Other (See Comments) 08/15/2020   Topiramate Other (See Comments) 07/31/2014   Codeine Nausea And Vomiting and Nausea Only 07/31/2014   Tramadol Nausea Only 08/15/2020    Past Medical History:  Diagnosis Date   Melanoma ([place])    Thyroid disease     Past Surgical History:  Procedure Laterality Date   BREAST REDUCTION SURGERY     melanoma removal     back     No family history on file.  Social History   Socioeconomic History   Marital status: Married    Spouse name: Not on file   Number of children: Not on file   Years of education: Not on file   Highest education level: Not on file  Occupational History   Not on file  Tobacco Use   Smoking status: Never   Smokeless tobacco: Never  Substance and Sexual Activity   Alcohol use: Yes    Comment: occassionally   Drug use: No   Sexual activity: Yes    Birth control/protection: Pill  Other Topics Concern   Not on file  Social History Narrative  Not on file   Social Determinants of Health   Financial Resource Strain: Not on file  Food Insecurity: Not on file  Transportation Needs: Not on file  Physical Activity: Not on file  Stress: Not on file  Social Connections: Not on file  Intimate Partner Violence: Not on file    Review of Systems  Constitutional: Negative.  Negative for fatigue.  Respiratory:  Negative for cough and shortness of breath.   Cardiovascular:  Negative for chest pain.   Vitals:    03/15/21 0901  BP: 130/72  Pulse: 70  SpO2: 96%     Physical Exam Constitutional:      Appearance: She is obese.  HENT:     Head: Normocephalic and atraumatic.     Nose: No congestion.     Mouth/Throat:     Mouth: Mucous membranes are moist.  Eyes:     General:        Right eye: No discharge.        Left eye: No discharge.  Cardiovascular:     Rate and Rhythm: Normal rate and regular rhythm.     Heart sounds: No murmur heard.   No friction rub.  Pulmonary:     Effort: Pulmonary effort is normal. No respiratory distress.     Breath sounds: Normal breath sounds. No stridor. No wheezing or rhonchi.  Musculoskeletal:     Cervical back: No rigidity or tenderness.  Neurological:     Mental Status: She is alert.  Psychiatric:        Mood and Affect: Mood normal.   Data Reviewed: Most recent high-resolution CT scan of the chest reviewed with the patient -Groundglass changes noted -Semisolid nodule no longer visible-resolved -Reviewed with patient  Pulmonary function test reviewed showing no obstruction, no restriction, normal diffusing capacity  Assessment:   Rheumatoid arthritis -On Enbrel, methotrexate, Plaquenil  Interstitial lung disease -Related to rheumatoid arthritis -Normal PFT, only mild interstitial changes on CT scan  Semisolid nodule -No longer present on repeat CT  Continues to follow with rheumatology  Ascending thoracic aortic aneurysm -Referral to vascular surgery for monitoring  Plan/Recommendations:  Follow-up in 1 year  No need for repeat CT scan or PFT unless symptomatic  Encouraged to call with any significant concerns  Will make an appointment to see vascular surgery for evaluation and follow-up of aortic aneurysm  I spent 30 minutes dedicated to the care of this patient on the date of this encounter to include previsit review of records, face-to-face time with the patient discussing conditions above, post visit ordering of testing,  clinical documentation with electronic health record and communicated necessary findings to members of the patient's care team  Sherrilyn Rist MD [facility] 03/15/2021, 9:09 AM  CC: Shirline Frees, MD

## 2021-03-15 NOTE — Patient Instructions (Addendum)
I will see you back in a year  No changes need made at present  Referral to vascular surgery for ascending aortic thoracic aneurysm for evaluation and monitoring  Call with any significant concerns

## 2021-03-21 ENCOUNTER — Ambulatory Visit: Admit: 2021-03-21 | Discharge: 2021-03-22 | Payer: PRIVATE HEALTH INSURANCE

## 2021-03-21 DIAGNOSIS — M059 Rheumatoid arthritis with rheumatoid factor, unspecified: Principal | ICD-10-CM

## 2021-03-26 ENCOUNTER — Telehealth: Payer: Self-pay | Admitting: Pulmonary Disease

## 2021-03-26 DIAGNOSIS — I712 Thoracic aortic aneurysm, without rupture: Secondary | ICD-10-CM

## 2021-03-26 DIAGNOSIS — I7121 Aneurysm of the ascending aorta, without rupture: Secondary | ICD-10-CM

## 2021-03-26 NOTE — Telephone Encounter (Signed)
LM with referral coordinator at vascular office making them aware we got the message and a cardiology referral has been placed.   Nothing further needed at this time.

## 2021-04-26 MED ORDER — ENBREL SURECLICK 50 MG/ML (1 ML) SUBCUTANEOUS PEN INJECTOR
SUBCUTANEOUS | 11 refills | 28.00000 days | Status: CP
Start: 2021-04-26 — End: ?

## 2021-04-29 DIAGNOSIS — M059 Rheumatoid arthritis with rheumatoid factor, unspecified: Principal | ICD-10-CM

## 2021-04-29 MED ORDER — ENBREL SURECLICK 50 MG/ML (1 ML) SUBCUTANEOUS PEN INJECTOR
SUBCUTANEOUS | 11 refills | 28.00000 days | Status: CP
Start: 2021-04-29 — End: ?

## 2021-05-02 DIAGNOSIS — M059 Rheumatoid arthritis with rheumatoid factor, unspecified: Principal | ICD-10-CM

## 2021-05-04 MED ORDER — PREDNISONE 1 MG TABLET
ORAL_TABLET | Freq: Every day | ORAL | 2 refills | 0.00000 days
Start: 2021-05-04 — End: ?

## 2021-05-06 MED ORDER — PREDNISONE 1 MG TABLET
ORAL_TABLET | Freq: Every day | ORAL | 2 refills | 30 days | Status: CP
Start: 2021-05-06 — End: 2022-05-06

## 2021-05-15 MED ORDER — PREDNISONE 1 MG TABLET
ORAL_TABLET | Freq: Every day | ORAL | 0 refills | 30.00000 days | Status: CP
Start: 2021-05-15 — End: 2022-05-15

## 2021-05-23 ENCOUNTER — Encounter (HOSPITAL_BASED_OUTPATIENT_CLINIC_OR_DEPARTMENT_OTHER): Payer: Self-pay | Admitting: Cardiology

## 2021-05-23 ENCOUNTER — Other Ambulatory Visit: Payer: Self-pay

## 2021-05-23 ENCOUNTER — Ambulatory Visit (INDEPENDENT_AMBULATORY_CARE_PROVIDER_SITE_OTHER): Payer: No Typology Code available for payment source | Admitting: Cardiology

## 2021-05-23 VITALS — BP 130/80 | HR 82 | Ht 65.5 in | Wt 182.6 lb

## 2021-05-23 DIAGNOSIS — Z7189 Other specified counseling: Secondary | ICD-10-CM | POA: Diagnosis not present

## 2021-05-23 DIAGNOSIS — Z713 Dietary counseling and surveillance: Secondary | ICD-10-CM

## 2021-05-23 DIAGNOSIS — I2510 Atherosclerotic heart disease of native coronary artery without angina pectoris: Secondary | ICD-10-CM

## 2021-05-23 DIAGNOSIS — I712 Thoracic aortic aneurysm, without rupture, unspecified: Secondary | ICD-10-CM

## 2021-05-23 DIAGNOSIS — Z79899 Other long term (current) drug therapy: Secondary | ICD-10-CM

## 2021-05-23 DIAGNOSIS — Z7182 Exercise counseling: Secondary | ICD-10-CM

## 2021-05-23 DIAGNOSIS — R011 Cardiac murmur, unspecified: Secondary | ICD-10-CM

## 2021-05-23 MED ORDER — ROSUVASTATIN CALCIUM 20 MG PO TABS: 20.0000 mg | ORAL_TABLET | Freq: Every day | ORAL | 3 refills | Status: DC

## 2021-05-23 NOTE — Patient Instructions (Signed)
Medication Instructions:  Start Rosuvastatin 20 mg daily  *If you need a refill on your cardiac medications before your next appointment, please call your pharmacy*   Lab Work: Your physician recommends that you return in 2-3 months for lab work (fasting Lipid)    Testing/Procedures: Your physician has requested that you have an echocardiogram. Echocardiography is a painless test that uses sound waves to create images of your heart. It provides your doctor with information about the size and shape of your heart and how well your heart's chambers and valves are working. This procedure takes approximately one hour. There are no restrictions for this procedure. [place] [address]    Follow-Up: At [facility], you and your health needs are our priority.  As part of our continuing mission to provide you with exceptional heart care, we have created designated Provider Care Teams.  These Care Teams include your primary Cardiologist (physician) and Advanced Practice Providers (APPs -  Physician Assistants and Nurse Practitioners) who all work together to provide you with the care you need, when you need it.  We recommend signing up for the patient portal called "MyChart".  Sign up information is provided on this After Visit Summary.  MyChart is used to connect with patients for Virtual Visits (Telemedicine).  Patients are able to view lab/test results, encounter notes, upcoming appointments, etc.  Non-urgent messages can be sent to your provider as well.   To learn more about what you can do with MyChart, go to [email or web address].    Your next appointment:   1 year(s)  The format for your next appointment:   In Person  Provider:   Buford Dresser, MD

## 2021-05-23 NOTE — Progress Notes (Signed)
Cardiology Office Note:    Date:  05/23/2021   ID:  Carla Holloway, DOB Dec 22, 1960, MRN [id number]  PCP:  Carla Frees, MD  Cardiologist:  Carla Dresser, MD  Referring MD: Carla Coder, MD   CC: new patient consultation for thoracic aortic aneurysm  History of Present Illness:    Carla Holloway is Holloway 60 y.o. female with Holloway hx of rheumatoid arthritis, interstitial lung disease. who is seen as Holloway new consult at the request of Olalere, Adewale A, MD for the evaluation and management of ascending aortic aneurysm.  Note from Dr. Ander Holloway from 03/15/21 reviewed. Per note, referral to vascular surgery for TAA of 4.1 cm.  Cardiovascular risk factors: Prior clinical ASCVD: none Comorbid conditions: Denies hypertension, diabetes, chronic kidney disease. Endorses history of hypercholesterolemia, did not tolerate atorvastatin due to muscle cramps. Metabolic syndrome/Obesity: highest adult weight 225 lbs. Chronic inflammatory conditions: rheumatoid arthritis, on immunosuppression (etanercept, hydorxychloroquine, prednisone, methotrexate) Tobacco use history: never Family history: both parents were alcoholics, father died of MI in her 54s. Mother died of cirrhosis at age 33. Carla Holloway died of appendicitis in her 41s. Pat gpa unclear heart issues. Mat gma had RA, Mat gpa had lung cancer, died of possible MI in his 92s. 2 sisters, one had breast cancer, the other is overweight and has hypertension. No one with valve issues, aneurysms, or sudden cardiac death Prior cardiac testing and/or incidental findings on other testing (ie coronary calcium): coronary calcium noted on CT chest 03/12/21. Had echo, stress test at [facility] ~10 years ago, told it was normal. Done for intermittent PVCs, now well controlled when she monitors caffeine.  Denies chest pain. No PND, orthopnea, LE edema or unexpected weight gain. No syncope or palpitations.   Past Medical History:  Diagnosis Date   Melanoma ([place])     Thyroid disease     Past Surgical History:  Procedure Laterality Date   BREAST REDUCTION SURGERY     melanoma removal     back     Current Medications: Current Outpatient Medications on File Prior to Visit  Medication Sig   Ascorbic Acid (VITAMIN C) 500 MG CAPS    Calcium Polycarbophil (FIBER-CAPS PO)    Calcium-Magnesium-Vitamin D (CALCIUM 1200+D3 PO)    docusate sodium (COLACE) 100 MG capsule Take 100 mg by mouth 2 (two) times daily.   etanercept (ENBREL SURECLICK) 50 MG/ML injection Inject into the skin.   Fexofenadine HCl (ALLEGRA PO) Take 1 tablet by mouth daily.   folic acid (FOLVITE) 1 MG tablet Take by mouth.   hydroxychloroquine (PLAQUENIL) 200 MG tablet Take 200 mg by mouth 2 (two) times daily.   Ibuprofen 200 MG CAPS    lansoprazole (PREVACID) 30 MG capsule Take 30 mg by mouth as needed.   levothyroxine (SYNTHROID) 175 MCG tablet levothyroxine 175 mcg tablet  TAKE 1 TABLET BY MOUTH EVERY DAY   methotrexate (RHEUMATREX) 2.5 MG tablet Take 20 mg by mouth once Holloway week.   Omega-3 Fatty Acids (FISH OIL PO) Take 1 capsule by mouth daily.   predniSONE 1 MG TBEC 2 mg.   No current facility-administered medications on file prior to visit.     Allergies:   Atorvastatin, Topiramate, Codeine, and Tramadol   Social History   Tobacco Use   Smoking status: Never   Smokeless tobacco: Never  Substance Use Topics   Alcohol use: Yes    Comment: occassionally   Drug use: No    Family History: both parents were alcoholics,  father died of MI in her 4s. Mother died of cirrhosis at age 26. Carla Holloway died of appendicitis in her 41s. Pat gpa unclear heart issues. Mat gma had RA, Mat gpa had lung cancer, died of possible MI in his 18s. 2 sisters, one had breast cancer, the other is overweight and has hypertension. No one with valve issues, aneurysms, or sudden cardiac death  ROS:   Please see the history of present illness.  Additional pertinent ROS: Constitutional: Negative for  chills, fever, night sweats, unintentional weight loss  HENT: Negative for ear pain and hearing loss.   Eyes: Negative for loss of vision and eye pain.  Respiratory: Negative for cough, sputum, wheezing.   Cardiovascular: See HPI. Gastrointestinal: Negative for abdominal pain, melena, and hematochezia.  Genitourinary: Negative for dysuria and hematuria.  Musculoskeletal: Negative for falls and myalgias.  Skin: Negative for itching and rash.  Neurological: Negative for focal weakness, focal sensory changes and loss of consciousness.  Endo/Heme/Allergies: Does not bruise/bleed easily.     EKGs/Labs/Other Studies Reviewed:    The following studies were reviewed today: CT chest 03/12/21 Atherosclerosis of thoracic aorta is noted. 4.1 cm ascending thoracic aortic aneurysm is noted. Normal cardiac size. No pericardial effusion. Coronary artery calcifications are noted.  CT chest 09/18/20 Cardiovascular: Normal heart size. No significant pericardial effusion/thickening. Three-vessel coronary atherosclerosis. Atherosclerotic thoracic aorta with ectatic 4.0 cm ascending thoracic aorta, stable. Normal caliber pulmonary arteries  CT chest 05/30/20 Cardiovascular: No significant vascular findings. Normal heart size. No pericardial effusion. No definite thoracic aortic aneurysm, although evaluation of the ascending aorta is limited by motion artifact. Coronary, aortic arch, and branch vessel atherosclerotic vascular disease.  EKG:  EKG is personally reviewed.   05/23/21 NSR at 82 bpm  Recent Labs: No results found for requested labs within last 8760 hours.  Recent Lipid Panel No results found for: CHOL, TRIG, HDL, CHOLHDL, VLDL, LDLCALC, LDLDIRECT  Physical Exam:    VS:  BP 130/80   Pulse 82   Ht 5' 5.5" (1.664 m)   Wt 182 lb 9.6 oz (82.8 kg)   SpO2 98%   BMI 29.92 kg/m     Wt Readings from Last 3 Encounters:  05/23/21 182 lb 9.6 oz (82.8 kg)  03/15/21 187 lb 12.8 oz (85.2 kg)   10/11/20 197 lb (89.4 kg)    GEN: Well nourished, well developed in no acute distress HEENT: Normal, moist mucous membranes NECK: No JVD CARDIAC: regular rhythm, normal S1 and S2, no rubs or gallops. 2/6 systolic murmur. VASCULAR: Radial and DP pulses 2+ bilaterally. No carotid bruits RESPIRATORY:  Clear to auscultation without rales, wheezing or rhonchi  ABDOMEN: Soft, non-tender, non-distended MUSCULOSKELETAL:  Ambulates independently SKIN: Warm and dry, no edema NEUROLOGIC:  Alert and oriented x 3. No focal neuro deficits noted. PSYCHIATRIC:  Normal affect    ASSESSMENT:    1. Thoracic aortic aneurysm without rupture ([place])   2. Coronary artery calcification seen on CT scan   3. Cardiac risk counseling   4. Counseling on health promotion and disease prevention   5. Nutritional counseling   6. Exercise counseling   7. Murmur, cardiac   8. Medication management    PLAN:    Murmur Thoracic aortic aneurysm -need to exclude bicuspid aortic valve, as this would change management of TAA  Coronary artery calcification We reviewed the calcium findings at length, including actual images as well as the graph showing mortality based on calcium score. We discussed the pathophysiology of cholesterol  plaque formation, the role of calcium and why it is Holloway marker, how plaque is key to acute MI/CVA, and how known plaque is managed with medications.   --we discussed the data on statins, both in terms of their long term benefit as well as the risk of side effects. Reviewed common misconceptions about statins. Reviewed how we monitor treatment. After shared decision making, patient is agreeable to trialing statin.  -starting rosuvastatin 20 mg daily, recheck lipids/lfts in 2-3 mos  Cardiac risk counseling and prevention recommendations: discussed extensively today, especially diet/exercise -recommend heart healthy/Mediterranean diet, with whole grains, fruits, vegetable, fish, lean meats, nuts,  and olive oil. Limit salt. -recommend moderate walking, 3-5 times/week for 30-50 minutes each session. Aim for at least 150 minutes.week. Goal should be pace of 3 miles/hours, or walking 1.5 miles in 30 minutes -recommend avoidance of tobacco products. Avoid excess alcohol.  Plan for follow up: 1 year or sooner based on results of testing  Carla Dresser, MD, PhD, [facility]    Medication Adjustments/Labs and Tests Ordered: Current medicines are reviewed at length with the patient today.  Concerns regarding medicines are outlined above.  Orders Placed This Encounter  Procedures   Lipid panel   EKG 12-Lead   ECHOCARDIOGRAM COMPLETE    Meds ordered this encounter  Medications   rosuvastatin (CRESTOR) 20 MG tablet    Sig: Take 1 tablet (20 mg total) by mouth daily.    Dispense:  90 tablet    Refill:  3     Patient Instructions  Medication Instructions:  Start Rosuvastatin 20 mg daily  *If you need Holloway refill on your cardiac medications before your next appointment, please call your pharmacy*   Lab Work: Your physician recommends that you return in 2-3 months for lab work (fasting Lipid)    Testing/Procedures: Your physician has requested that you have an echocardiogram. Echocardiography is Holloway painless test that uses sound waves to create images of your heart. It provides your doctor with information about the size and shape of your heart and how well your heart's chambers and valves are working. This procedure takes approximately one hour. There are no restrictions for this procedure. [place] [address]    Follow-Up: At [facility], you and your health needs are our priority.  As part of our continuing mission to provide you with exceptional heart care, we have created designated Provider Care Teams.  These Care Teams include your primary Cardiologist (physician) and Advanced Practice Providers (APPs -  Physician Assistants and  Nurse Practitioners) who all work together to provide you with the care you need, when you need it.  We recommend signing up for the patient portal called "MyChart".  Sign up information is provided on this After Visit Summary.  MyChart is used to connect with patients for Virtual Visits (Telemedicine).  Patients are able to view lab/test results, encounter notes, upcoming appointments, etc.  Non-urgent messages can be sent to your provider as well.   To learn more about what you can do with MyChart, go to [email or web address].    Your next appointment:   1 year(s)  The format for your next appointment:   In Person  Provider:   Buford Dresser, MD     Signed, Carla Dresser, MD PhD 05/23/2021     [place]

## 2021-05-30 ENCOUNTER — Ambulatory Visit (INDEPENDENT_AMBULATORY_CARE_PROVIDER_SITE_OTHER): Payer: No Typology Code available for payment source

## 2021-05-30 ENCOUNTER — Other Ambulatory Visit: Payer: Self-pay

## 2021-05-30 DIAGNOSIS — M059 Rheumatoid arthritis with rheumatoid factor, unspecified: Principal | ICD-10-CM

## 2021-05-30 DIAGNOSIS — I712 Thoracic aortic aneurysm, without rupture, unspecified: Secondary | ICD-10-CM

## 2021-05-30 DIAGNOSIS — R011 Cardiac murmur, unspecified: Secondary | ICD-10-CM | POA: Diagnosis not present

## 2021-05-30 LAB — ECHOCARDIOGRAM COMPLETE
Area-P 1/2: 2.99 cm2
P 1/2 time: 525 msec
S' Lateral: 2.5 cm

## 2021-05-30 MED ORDER — HYDROXYCHLOROQUINE 200 MG TABLET
ORAL_TABLET | Freq: Two times a day (BID) | ORAL | 11 refills | 30 days | Status: CP
Start: 2021-05-30 — End: ?

## 2021-07-23 ENCOUNTER — Telehealth: Admit: 2021-07-23 | Discharge: 2021-07-24 | Payer: PRIVATE HEALTH INSURANCE | Attending: Family | Primary: Family

## 2021-07-23 DIAGNOSIS — Z79631 Methotrexate, long term, current use: Principal | ICD-10-CM

## 2021-07-23 DIAGNOSIS — M059 Rheumatoid arthritis with rheumatoid factor, unspecified: Principal | ICD-10-CM

## 2021-07-23 MED ORDER — PREDNISONE 1 MG TABLET
ORAL_TABLET | Freq: Every day | ORAL | 0 refills | 90 days | Status: CP
Start: 2021-07-23 — End: 2022-07-23

## 2021-08-05 DIAGNOSIS — M059 Rheumatoid arthritis with rheumatoid factor, unspecified: Principal | ICD-10-CM

## 2021-08-05 MED ORDER — METHOTREXATE SODIUM 2.5 MG TABLET
ORAL_TABLET | ORAL | 0 refills | 84.00000 days | Status: CP
Start: 2021-08-05 — End: 2021-11-03

## 2021-08-23 LAB — LIPID PANEL
Chol/HDL Ratio: 2.9 ratio (ref 0.0–4.4)
Cholesterol, Total: 145 mg/dL (ref 100–199)
HDL: 50 mg/dL (ref >39)
LDL Chol Calc (NIH): 75 mg/dL (ref 0–99)
Triglycerides: 111 mg/dL (ref 0–149)
VLDL Cholesterol Cal: 20 mg/dL (ref 5–40)

## 2021-08-29 DIAGNOSIS — M059 Rheumatoid arthritis with rheumatoid factor, unspecified: Principal | ICD-10-CM

## 2021-10-20 ENCOUNTER — Encounter (HOSPITAL_COMMUNITY): Payer: Self-pay | Admitting: Emergency Medicine

## 2021-10-20 ENCOUNTER — Emergency Department (HOSPITAL_COMMUNITY): Payer: No Typology Code available for payment source

## 2021-10-20 ENCOUNTER — Emergency Department (HOSPITAL_COMMUNITY)
Admission: EM | Admit: 2021-10-20 | Discharge: 2021-10-20 | Disposition: A | Discharge status: Home / Self Care | Payer: No Typology Code available for payment source | Attending: Emergency Medicine | Admitting: Emergency Medicine

## 2021-10-20 ENCOUNTER — Other Ambulatory Visit: Payer: Self-pay

## 2021-10-20 DIAGNOSIS — M059 Rheumatoid arthritis with rheumatoid factor, unspecified: Principal | ICD-10-CM

## 2021-10-20 DIAGNOSIS — R079 Chest pain, unspecified: Secondary | ICD-10-CM

## 2021-10-20 DIAGNOSIS — R0789 Other chest pain: Secondary | ICD-10-CM | POA: Diagnosis present

## 2021-10-20 DIAGNOSIS — R059 Cough, unspecified: Secondary | ICD-10-CM | POA: Insufficient documentation

## 2021-10-20 LAB — CBC WITH DIFFERENTIAL/PLATELET
Abs Immature Granulocytes: 0.01 10*3/uL (ref 0.00–0.07)
Basophils Absolute: 0 10*3/uL (ref 0.0–0.1)
Basophils Relative: 0 %
Eosinophils Absolute: 0.1 10*3/uL (ref 0.0–0.5)
Eosinophils Relative: 2 %
HCT: 38.9 % (ref 36.0–46.0)
Hemoglobin: 12.7 g/dL (ref 12.0–15.0)
Immature Granulocytes: 0 %
Lymphocytes Relative: 24 %
Lymphs Abs: 1.3 10*3/uL (ref 0.7–4.0)
MCH: 33.5 pg (ref 26.0–34.0)
MCHC: 32.6 g/dL (ref 30.0–36.0)
MCV: 102.6 fL — ABNORMAL HIGH (ref 80.0–100.0)
Monocytes Absolute: 0.4 10*3/uL (ref 0.1–1.0)
Monocytes Relative: 7 %
Neutro Abs: 3.6 10*3/uL (ref 1.7–7.7)
Neutrophils Relative %: 67 %
Platelets: 243 10*3/uL (ref 150–400)
RBC: 3.79 MIL/uL — ABNORMAL LOW (ref 3.87–5.11)
RDW: 13.3 % (ref 11.5–15.5)
WBC: 5.3 10*3/uL (ref 4.0–10.5)
nRBC: 0 % (ref 0.0–0.2)

## 2021-10-20 LAB — BASIC METABOLIC PANEL
Anion gap: 8 (ref 5–15)
BUN: 9 mg/dL (ref 6–20)
CO2: 27 mmol/L (ref 22–32)
Calcium: 9.1 mg/dL (ref 8.9–10.3)
Chloride: 108 mmol/L (ref 98–111)
Creatinine, Ser: 0.67 mg/dL (ref 0.44–1.00)
GFR, Estimated: >60 mL/min (ref >60)
Glucose, Bld: 97 mg/dL (ref 70–99)
Potassium: 3.7 mmol/L (ref 3.5–5.1)
Sodium: 143 mmol/L (ref 135–145)

## 2021-10-20 LAB — TROPONIN I (HIGH SENSITIVITY)
Troponin I (High Sensitivity): 3 ng/L (ref <18)
Troponin I (High Sensitivity): 3 ng/L (ref <18)

## 2021-10-20 LAB — D-DIMER, QUANTITATIVE: D-Dimer, Quant: 9.62 ug/mL-FEU — ABNORMAL HIGH (ref 0.00–0.50)

## 2021-10-20 MED ORDER — METHOTREXATE SODIUM 2.5 MG TABLET
ORAL_TABLET | ORAL | 0 refills | 0 days
Start: 2021-10-20 — End: ?

## 2021-10-20 MED ORDER — IOHEXOL 350 MG/ML SOLN
71.0000 mL | Freq: Once | INTRAVENOUS | Status: AC | PRN
  Administered 2021-10-20: 71 mL via INTRAVENOUS

## 2021-10-20 NOTE — Discharge Instructions (Signed)
Return for any problem.  ?

## 2021-10-20 NOTE — ED Triage Notes (Signed)
Patient arrives ambulatory c/o left sided chest pain x 1 week. Patient states last Sunday she woke up from her sleep with her heart racing at about 120 bmp and drank some water and subsided. Describes pain as sharp and heaviness at a 2/10. Reports a chronic cough with some lung issue history.

## 2021-10-20 NOTE — ED Provider Notes (Signed)
[facility] EMERGENCY DEPARTMENT Provider Note   CSN: [id number] Arrival date & time: 10/20/21  1010     History  Chief Complaint  Patient presents with   Chest Pain    Carla Holloway is a 61 y.o. female.  61 year old female with prior medical history as detailed low presents for evaluation of reported chest discomfort.  Patient reports left-sided sharp chest discomfort x1 week.  Patient reports the pain woke her from sleep last Sunday.  She felt that her heart was moving fast at the same time.  She reports the pain is sharp.  She reports that movement and sometimes deep coughing make the pain worse.  She denies associated nausea, vomiting, diaphoresis, or shortness of breath.  The history is provided by the patient and medical records.  Chest Pain Pain location:  L chest Pain quality: sharp   Pain radiates to:  Does not radiate Pain severity:  Mild Onset quality:  Sudden Duration:  1 week Timing:  Intermittent Progression:  Waxing and waning Chronicity:  New     Home Medications Prior to Admission medications   Medication Sig Start Date End Date Taking? Authorizing Provider  Ascorbic Acid (VITAMIN C) 500 MG CAPS     [provider]  Calcium Polycarbophil (FIBER-CAPS PO)     [provider]  Calcium-Magnesium-Vitamin D (CALCIUM 1200+D3 PO)     [provider]  docusate sodium (COLACE) 100 MG capsule Take 100 mg by mouth 2 (two) times daily.    [provider]  etanercept (ENBREL SURECLICK) 50 MG/ML injection Inject into the skin. 03/30/19   [provider]  Fexofenadine HCl (ALLEGRA PO) Take 1 tablet by mouth daily.    [provider]  hydroxychloroquine (PLAQUENIL) 200 MG tablet Take 200 mg by mouth 2 (two) times daily. 08/05/20   [provider]  Ibuprofen 200 MG CAPS     [provider]  lansoprazole (PREVACID) 30 MG capsule Take 30 mg by mouth as needed.    [provider]  levothyroxine (SYNTHROID) 175 MCG tablet levothyroxine 175 mcg tablet  TAKE 1 TABLET BY MOUTH EVERY DAY 07/09/14   [provider]  methotrexate (RHEUMATREX) 2.5 MG tablet Take 20 mg by mouth once a week. 02/13/21   [provider]  Omega-3 Fatty Acids (FISH OIL PO) Take 1 capsule by mouth daily.    [provider]  predniSONE 1 MG TBEC 2 mg.    [provider]  rosuvastatin (CRESTOR) 20 MG tablet Take 1 tablet (20 mg total) by mouth daily. 05/23/21 05/18/22  Buford Dresser, MD      Allergies    Atorvastatin, Topiramate, Codeine, and Tramadol    Review of Systems   Review of Systems  Cardiovascular:  Positive for chest pain.  All other systems reviewed and are negative.  Physical Exam Updated Vital Signs BP 115/86    Pulse 71    Temp 98.3 F (36.8 C) (Oral)    Resp 12    Ht 5\' 5"  (1.651 m)    Wt 84.8 kg    SpO2 97%    BMI 31.12 kg/m  Physical Exam Vitals and nursing note reviewed.  Constitutional:      General: She is not in acute distress.    Appearance: Normal appearance. She is well-developed.  HENT:     Head: Normocephalic and atraumatic.  Eyes:     Conjunctiva/sclera: Conjunctivae normal.     Pupils: Pupils are equal, round, and  reactive to light.  Cardiovascular:     Rate and Rhythm: Normal rate and regular rhythm.     Heart sounds: Normal heart sounds.  Pulmonary:     Effort: Pulmonary effort is normal. No respiratory distress.     Breath sounds: Normal breath sounds.  Abdominal:     General: There is no distension.     Palpations: Abdomen is soft.     Tenderness: There is no abdominal tenderness.  Musculoskeletal:        General: No deformity. Normal range of motion.     Cervical back: Normal range of motion and neck supple.  Skin:    General: Skin is warm and dry.  Neurological:     General: No focal deficit present.     Mental Status: She is alert and oriented to person, place, and time.    ED Results / Procedures  / Treatments   Labs (all labs ordered are listed, but only abnormal results are displayed) Labs Reviewed  CBC WITH DIFFERENTIAL/PLATELET - Abnormal; Notable for the following components:      Result Value   RBC 3.79 (*)    MCV 102.6 (*)    All other components within normal limits  D-DIMER, QUANTITATIVE - Abnormal; Notable for the following components:   D-Dimer, Quant 9.62 (*)    All other components within normal limits  BASIC METABOLIC PANEL  TROPONIN I (HIGH SENSITIVITY)  TROPONIN I (HIGH SENSITIVITY)    EKG EKG Interpretation  Date/Time:  Sunday October 20 2021 10:23:09 EST Ventricular Rate:  76 PR Interval:  170 QRS Duration: 94 QT Interval:  374 QTC Calculation: 420 R Axis:   44 Text Interpretation: Normal sinus rhythm Normal ECG When compared with ECG of 03-Dec-2011 13:50, PREVIOUS ECG IS PRESENT Confirmed by Dene Gentry [phone]) on 10/20/2021 10:36:49 AM  Radiology DG Chest 2 View  Result Date: 10/20/2021 CLINICAL DATA:  Chest pain. EXAM: CHEST - 2 VIEW COMPARISON:  04/23/2020 FINDINGS: The lungs are clear without focal pneumonia, edema, pneumothorax or pleural effusion. The cardiopericardial silhouette is within normal limits for size. The visualized bony structures of the thorax show no acute abnormality. Telemetry leads overlie the chest. IMPRESSION: No active cardiopulmonary disease. Electronically Signed   By: Misty Stanley M.D.   On: 10/20/2021 11:09    Procedures Procedures    Medications Ordered in ED Medications - No data to display  ED Course/ Medical Decision Making/ A&P                           Medical Decision Making Amount and/or Complexity of Data Reviewed Labs: ordered. Radiology: ordered.  Risk Prescription drug management.    Medical Screen Complete  This patient presented to the ED with complaint of sharp left-sided chest discomfort that is moderately pleuritic in nature.  This complaint involves an extensive number of treatment  options. The initial differential diagnosis includes, but is not limited to, pleurisy, pneumonia, PE, ACS, metabolic abnormality, etc.  This presentation is: Acute, Self-Limited, Previously Undiagnosed, Uncertain Prognosis, Complicated, Systemic Symptoms, and Threat to Life/Bodily Function  Patient presented with complaint of left-sided chest discomfort.  Patient is describing pain that is pleuritic in nature.  Initial EKG is without evidence of acute ischemia.  Initial troponin is reassuringly low.  D-dimer is noted to be elevated.  CT angio to rule out possible PE is negative.  Other obtained labs are without significant abnormality.  Patient feels improved.  Patient understands  need for close follow-up.  Strict return precautions given and understood.    Co morbidities that complicated the patient's evaluation  Interstitial lung disease   Additional history obtained:  Additional history obtained from Spouse External records from outside sources obtained and reviewed including prior ED visits and prior Inpatient records.    Lab Tests:  I ordered and personally interpreted labs.  The pertinent results include: CBC, BMP, D-dimer, troponin   Imaging Studies ordered:  I ordered imaging studies including chest x-ray, CTA PE I independently visualized and interpreted obtained imaging which showed no acute disease I agree with the radiologist interpretation.   Cardiac Monitoring:  The patient was maintained on a cardiac monitor.  I personally viewed and interpreted the cardiac monitor which showed an underlying rhythm of: Normal sinus rhythm    Problem List / ED Course:  Chest pain   Reevaluation:  After the interventions noted above, I reevaluated the patient and found that they have: improved   Disposition:  After consideration of the diagnostic results and the patients response to treatment, I feel that the patent would benefit from close outpatient.           Final Clinical Impression(s) / ED Diagnoses Final diagnoses:  Nonspecific chest pain    Rx / DC Orders ED Discharge Orders     None         Valarie Merino, MD 10/21/21 [phone]

## 2021-10-21 MED ORDER — METHOTREXATE SODIUM 2.5 MG TABLET
ORAL_TABLET | ORAL | 0 refills | 84 days | Status: CP
Start: 2021-10-21 — End: 2022-01-19

## 2021-10-28 DIAGNOSIS — M059 Rheumatoid arthritis with rheumatoid factor, unspecified: Principal | ICD-10-CM

## 2021-10-28 MED ORDER — PREDNISONE 1 MG TABLET
ORAL_TABLET | 0 refills | 0 days | Status: CP
Start: 2021-10-28 — End: ?

## 2021-11-19 ENCOUNTER — Ambulatory Visit: Admit: 2021-11-19 | Discharge: 2021-11-20 | Payer: PRIVATE HEALTH INSURANCE

## 2021-11-19 DIAGNOSIS — M059 Rheumatoid arthritis with rheumatoid factor, unspecified: Principal | ICD-10-CM

## 2021-11-19 DIAGNOSIS — Z79631 Methotrexate, long term, current use: Principal | ICD-10-CM

## 2021-11-19 DIAGNOSIS — M1990 Unspecified osteoarthritis, unspecified site: Principal | ICD-10-CM

## 2021-11-19 MED ORDER — HYDROXYCHLOROQUINE 200 MG TABLET
ORAL_TABLET | Freq: Two times a day (BID) | ORAL | 3 refills | 90 days | Status: CP
Start: 2021-11-19 — End: ?

## 2021-11-19 MED ORDER — METHOTREXATE SODIUM 2.5 MG TABLET
ORAL_TABLET | ORAL | 1 refills | 84 days | Status: CP
Start: 2021-11-19 — End: 2022-02-17

## 2021-12-06 ENCOUNTER — Ambulatory Visit: Admit: 2021-12-06 | Discharge: 2021-12-07 | Payer: PRIVATE HEALTH INSURANCE

## 2022-01-22 ENCOUNTER — Telehealth: Payer: Self-pay | Admitting: Pulmonary Disease

## 2022-01-22 DIAGNOSIS — M059 Rheumatoid arthritis with rheumatoid factor, unspecified: Principal | ICD-10-CM

## 2022-01-22 MED ORDER — PREDNISONE 1 MG TABLET
ORAL_TABLET | 3 refills | 0 days | Status: CP
Start: 2022-01-22 — End: ?

## 2022-01-23 NOTE — Telephone Encounter (Signed)
Called patient but she did not answer. Left message for her to call back.  

## 2022-02-11 ENCOUNTER — Encounter: Payer: Self-pay | Admitting: Pulmonary Disease

## 2022-02-11 DIAGNOSIS — R911 Solitary pulmonary nodule: Secondary | ICD-10-CM

## 2022-02-11 NOTE — Telephone Encounter (Signed)
Yes to CT before visit

## 2022-02-17 ENCOUNTER — Telehealth: Payer: Self-pay | Admitting: Pulmonary Disease

## 2022-02-17 NOTE — Telephone Encounter (Signed)
Carla Holloway has the order to do the [place] on it

## 2022-02-18 NOTE — Telephone Encounter (Signed)
Called patient and told her that we have received the PA for her CT scan that is on 6/30. I told her as soon as we got it approved that someone from the office will get in touch with her. Nothing further needed

## 2022-03-03 DIAGNOSIS — M059 Rheumatoid arthritis with rheumatoid factor, unspecified: Principal | ICD-10-CM

## 2022-03-03 MED ORDER — ENBREL SURECLICK 50 MG/ML (1 ML) SUBCUTANEOUS PEN INJECTOR
SUBCUTANEOUS | 11 refills | 28 days | Status: CP
Start: 2022-03-03 — End: ?

## 2022-03-04 DIAGNOSIS — M059 Rheumatoid arthritis with rheumatoid factor, unspecified: Principal | ICD-10-CM

## 2022-03-04 MED ORDER — ENBREL SURECLICK 50 MG/ML (1 ML) SUBCUTANEOUS PEN INJECTOR
SUBCUTANEOUS | 11 refills | 28 days | Status: CP
Start: 2022-03-04 — End: ?

## 2022-03-13 NOTE — Telephone Encounter (Signed)
Pt scheduled for a CT 6/30. Nothing further needed.

## 2022-03-14 ENCOUNTER — Ambulatory Visit
Admission: RE | Admit: 2022-03-14 | Discharge: 2022-03-14 | Disposition: A | Discharge status: Home / Self Care | Payer: No Typology Code available for payment source | Source: Ambulatory Visit | Attending: Pulmonary Disease | Admitting: Pulmonary Disease

## 2022-03-14 DIAGNOSIS — R911 Solitary pulmonary nodule: Secondary | ICD-10-CM

## 2022-03-26 ENCOUNTER — Ambulatory Visit: Admit: 2022-03-26 | Discharge: 2022-03-27 | Payer: PRIVATE HEALTH INSURANCE

## 2022-03-26 DIAGNOSIS — M059 Rheumatoid arthritis with rheumatoid factor, unspecified: Principal | ICD-10-CM

## 2022-03-26 DIAGNOSIS — Z79631 Methotrexate, long term, current use: Principal | ICD-10-CM

## 2022-03-26 MED ORDER — HUMIRA PEN CITRATE FREE 40 MG/0.4 ML
SUBCUTANEOUS | 6 refills | 56 days | Status: CP
Start: 2022-03-26 — End: ?

## 2022-03-27 ENCOUNTER — Other Ambulatory Visit: Payer: Self-pay | Admitting: Family Medicine

## 2022-03-27 DIAGNOSIS — I712 Thoracic aortic aneurysm, without rupture, unspecified: Secondary | ICD-10-CM

## 2022-03-28 DIAGNOSIS — M059 Rheumatoid arthritis with rheumatoid factor, unspecified: Principal | ICD-10-CM

## 2022-03-28 MED ORDER — HUMIRA PEN CITRATE FREE 40 MG/0.4 ML
SUBCUTANEOUS | 6 refills | 56 days | Status: CP
Start: 2022-03-28 — End: ?

## 2022-03-31 ENCOUNTER — Ambulatory Visit (INDEPENDENT_AMBULATORY_CARE_PROVIDER_SITE_OTHER): Payer: No Typology Code available for payment source | Admitting: Pulmonary Disease

## 2022-03-31 ENCOUNTER — Encounter: Payer: Self-pay | Admitting: Pulmonary Disease

## 2022-03-31 VITALS — BP 120/78 | HR 63 | Temp 97.8°F | Ht 65.0 in | Wt 187.0 lb

## 2022-03-31 DIAGNOSIS — R0602 Shortness of breath: Secondary | ICD-10-CM | POA: Diagnosis not present

## 2022-03-31 DIAGNOSIS — R911 Solitary pulmonary nodule: Secondary | ICD-10-CM

## 2022-03-31 NOTE — Patient Instructions (Signed)
Repeat CT scan of the chest in a year  Pulmonary function test at next visit in a year  Continue using your CPAP on a regular basis  Call us with significant concerns  I will see you a year from now

## 2022-03-31 NOTE — Progress Notes (Signed)
Carla Holloway    [id number]    07/10/1961  Primary Care Physician:Harris, Gwyndolyn Saxon, MD  Referring Physician: Shirline Frees, MD [place],  [place] [address]  Chief complaint: Follow-up for groundglass changes on CT Does have occasional chest discomfort  HPI:  Evaluated recently for pleuritis Switch over to Humira for rheumatoid arthritis Rheumatoid arthritis was diagnosed about 3 years ago  Follows up regularly  Diagnosed with obstructive sleep apnea about a year ago has been on CPAP therapy  Breathing feels about the same Energy levels feels about the same  Sleeping better with CPAP  No significant other symptoms apart from an occasional cough, chest discomfort sometimes with deep breathing Denies any other significant symptoms are present No limitations with respect to activities of daily living  Repeat CT shows resolution of lung nodule, still shows some interstitial infiltrates  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   Ascorbic Acid (VITAMIN C) 500 MG CAPS    Calcium Polycarbophil (FIBER-CAPS PO)    Calcium-Magnesium-Vitamin D (CALCIUM 1200+D3 PO)    docusate sodium (COLACE) 100 MG capsule Take 100 mg by mouth 2 (two) times daily.   etanercept (ENBREL SURECLICK) 50 MG/ML injection Inject into the skin.   Fexofenadine HCl (ALLEGRA PO) Take 1 tablet by mouth daily.   hydroxychloroquine (PLAQUENIL) 200 MG tablet Take 200 mg by mouth 2 (two) times daily.   Ibuprofen 200 MG CAPS    levothyroxine (SYNTHROID) 175 MCG tablet levothyroxine 175 mcg tablet  TAKE 1 TABLET BY MOUTH EVERY DAY   methotrexate (RHEUMATREX) 2.5 MG tablet Take 20 mg by mouth once a week.   Omega-3 Fatty Acids (FISH OIL PO) Take 1 capsule by mouth daily.   rosuvastatin (CRESTOR) 20 MG tablet Take 1  tablet (20 mg total) by mouth daily.   [DISCONTINUED] lansoprazole (PREVACID) 30 MG capsule Take 30 mg by mouth as needed.   [DISCONTINUED] predniSONE 1 MG TBEC 2 mg.   No facility-administered encounter medications on file as of 03/31/2022.    Allergies as of 03/31/2022 - Review Complete 03/31/2022  Allergen Reaction Noted   Topiramate Other (See Comments) 07/31/2014   Atorvastatin Nausea And Vomiting, Nausea Only, and Other (See Comments) 08/15/2020   Codeine Nausea And Vomiting and Nausea Only 07/31/2014   Tramadol Nausea Only 08/15/2020    Past Medical History:  Diagnosis Date   Melanoma ([place])    Thyroid disease     Past Surgical History:  Procedure Laterality Date   BREAST REDUCTION SURGERY     melanoma removal     back     No family history on file.  Social History   Socioeconomic History   Marital status: Married    Spouse name: Not on file   Number of children: Not on file   Years of education: Not on file   Highest education level: Not on file  Occupational History   Not on file  Tobacco Use   Smoking status: Never   Smokeless tobacco: Never  Substance and Sexual Activity   Alcohol use: Yes    Comment: occassionally   Drug use: No   Sexual activity: Yes    Birth control/protection: Pill  Other Topics Concern   Not on file  Social History Narrative   Not on file  Social Determinants of Health   Financial Resource Strain: Not on file  Food Insecurity: Not on file  Transportation Needs: Not on file  Physical Activity: Not on file  Stress: Not on file  Social Connections: Not on file  Intimate Partner Violence: Not on file    Review of Systems  Constitutional: Negative.  Negative for fatigue.  Respiratory:  Negative for cough and shortness of breath.   Cardiovascular:  Negative for chest pain.    Vitals:   03/31/22 0948  BP: 120/78  Pulse: 63  Temp: 97.8 F (36.6 C)  SpO2: 100%     Physical Exam Constitutional:      Appearance:  She is obese.  HENT:     Head: Normocephalic and atraumatic.     Nose: No congestion.     Mouth/Throat:     Mouth: Mucous membranes are moist.  Eyes:     General:        Right eye: No discharge.        Left eye: No discharge.  Cardiovascular:     Rate and Rhythm: Normal rate and regular rhythm.     Heart sounds: No murmur heard.    No friction rub.  Pulmonary:     Effort: Pulmonary effort is normal. No respiratory distress.     Breath sounds: Normal breath sounds. No stridor. No wheezing or rhonchi.  Musculoskeletal:     Cervical back: No rigidity or tenderness.  Neurological:     Mental Status: She is alert.  Psychiatric:        Mood and Affect: Mood normal.    Data Reviewed: Most recent high-resolution CT scan of the chest reviewed with the patient -Groundglass changes noted -Reviewed with patient  Pulmonary function test reviewed showing no obstruction, no restriction, normal diffusing capacity  Assessment:   Rheumatoid arthritis -Switched to Humira  Interstitial lung disease -Related to rheumatoid arthritis -Normal PFT, only mild interstitial changes on CT scan -Now on Humira  Obstructive sleep apnea -On CPAP therapy  Semisolid nodule -No longer present on repeat CT  Continues to follow with rheumatology  Ascending thoracic aortic aneurysm -Referral to vascular surgery for monitoring -Continues to follow-up  Plan/Recommendations:  Follow-up in 1 year  Repeat CT scan of the chest in a.m.  Repeat pulmonary function test in a year  Encouraged to call with any significant concerns  Sherrilyn Rist MD [facility] 03/31/2022, 10:08 AM  CC: Shirline Frees, MD

## 2022-04-04 ENCOUNTER — Encounter: Payer: Self-pay | Admitting: Pulmonary Disease

## 2022-04-10 ENCOUNTER — Ambulatory Visit (INDEPENDENT_AMBULATORY_CARE_PROVIDER_SITE_OTHER): Payer: No Typology Code available for payment source | Admitting: Cardiology

## 2022-04-10 ENCOUNTER — Encounter (HOSPITAL_BASED_OUTPATIENT_CLINIC_OR_DEPARTMENT_OTHER): Payer: Self-pay | Admitting: Cardiology

## 2022-04-10 VITALS — BP 110/75 | HR 71 | Ht 65.0 in | Wt 186.9 lb

## 2022-04-10 DIAGNOSIS — Z713 Dietary counseling and surveillance: Secondary | ICD-10-CM

## 2022-04-10 DIAGNOSIS — Z7189 Other specified counseling: Secondary | ICD-10-CM | POA: Diagnosis not present

## 2022-04-10 DIAGNOSIS — Z7182 Exercise counseling: Secondary | ICD-10-CM

## 2022-04-10 DIAGNOSIS — I2510 Atherosclerotic heart disease of native coronary artery without angina pectoris: Secondary | ICD-10-CM

## 2022-04-10 DIAGNOSIS — Z01812 Encounter for preprocedural laboratory examination: Secondary | ICD-10-CM

## 2022-04-10 DIAGNOSIS — R011 Cardiac murmur, unspecified: Secondary | ICD-10-CM

## 2022-04-10 DIAGNOSIS — I7121 Aneurysm of the ascending aorta, without rupture: Secondary | ICD-10-CM

## 2022-04-10 DIAGNOSIS — I700 Atherosclerosis of aorta: Secondary | ICD-10-CM

## 2022-04-10 NOTE — Progress Notes (Signed)
Cardiology Office Note:    Date:  04/11/2022   ID:  Carla Holloway, DOB March 10, 1961, MRN [id number]  PCP:  Shirline Frees, MD  Cardiologist:  Buford Dresser, MD  Referring MD: Shirline Frees, MD   CC: follow up thoracic aortic aneurysm  History of Present Illness:    Carla Holloway is a 61 y.o. female with a hx of rheumatoid arthritis, interstitial lung disease who is seen for follow up today. I initially met her 05/23/21 as a new consult at the request of Shirline Frees, MD for the evaluation and management of ascending aortic aneurysm.  Cardiovascular risk factors: Comorbid conditions: TAA (4.1 cm). Hypercholesterolemia, did not tolerate atorvastatin due to muscle cramps. Metabolic syndrome/Obesity: highest adult weight 225 lbs. Chronic inflammatory conditions: rheumatoid arthritis, on immunosuppression (etanercept, hydorxychloroquine, prednisone, methotrexate). About to start Humira. Prior cardiac testing and/or incidental findings on other testing (ie coronary calcium): coronary calcium noted on CT chest 03/12/21. Had echo, stress test at [facility] ~10 years ago, told it was normal. Done for intermittent PVCs, now well controlled when she monitors caffeine.  Today: Brings a list of concerns: -two episodes of dizziness after showering/taking a hot bath. May have lost consciousness. -went to ER with chest pain/shortness of breath, told it was pleurisy. Can occasionally have pain with changing position. -now on CPAP for sleep apnea -trying to work on [employer] changes.  We reviewed the above concerns. Suspect that lightheadedness/dizziness may be vasovagal as she likes to take very hot showers/baths. Recommended cooler bathing, sitting down once she gets out, avoiding fast movement.  Reviewed recent CT results, see below. Scan from 10/20/21 specifically says no TAA, but on my review it is stable at 41 mm.  Blood pressure well controlled. On rosuvastatin 20 mg daily for  coronary and aortic atherosclerosis.   Tries to walk several times/week.   Denies shortness of breath at rest or with normal exertion. No PND, orthopnea, LE edema or unexpected weight gain. No syncope or palpitations.   Past Medical History:  Diagnosis Date   Melanoma ([place])    Thyroid disease     Past Surgical History:  Procedure Laterality Date   BREAST REDUCTION SURGERY     melanoma removal     back     Current Medications: Current Outpatient Medications on File Prior to Visit  Medication Sig   Adalimumab (HUMIRA PEN) 40 MG/0.4ML PNKT Inject into the skin.   Ascorbic Acid (VITAMIN C) 500 MG CAPS    Calcium Polycarbophil (FIBER-CAPS PO)    Calcium-Magnesium-Vitamin D (CALCIUM 1200+D3 PO)    docusate sodium (COLACE) 100 MG capsule Take 100 mg by mouth 2 (two) times daily.   Fexofenadine HCl (ALLEGRA PO) Take 1 tablet by mouth daily.   hydroxychloroquine (PLAQUENIL) 200 MG tablet Take 200 mg by mouth 2 (two) times daily.   Ibuprofen 200 MG CAPS    levothyroxine (SYNTHROID) 175 MCG tablet levothyroxine 175 mcg tablet  TAKE 1 TABLET BY MOUTH EVERY DAY   methotrexate (RHEUMATREX) 2.5 MG tablet Take 20 mg by mouth once a week.   Omega-3 Fatty Acids (FISH OIL PO) Take 1 capsule by mouth daily.   rosuvastatin (CRESTOR) 20 MG tablet Take 1 tablet (20 mg total) by mouth daily.   No current facility-administered medications on file prior to visit.     Allergies:   Topiramate, Atorvastatin, Codeine, and Tramadol   Social History   Tobacco Use   Smoking status: Never   Smokeless tobacco: Never  Substance Use  Topics   Alcohol use: Yes    Comment: occassionally   Drug use: No    Family History: both parents were alcoholics, father died of MI in her 34s. Mother died of cirrhosis at age 66. Teena Irani died of appendicitis in her 53s. Pat gpa unclear heart issues. Mat gma had RA, Mat gpa had lung cancer, died of possible MI in his 68s. 2 sisters, one had breast cancer, the other is  overweight and has hypertension. No one with valve issues, aneurysms, or sudden cardiac death  ROS:   Please see the history of present illness.  Additional pertinent ROS otherwise unremarkable.   EKGs/Labs/Other Studies Reviewed:    The following studies were reviewed today:  CT chest wo contrast 03/16/22 Cardiovascular: The heart is normal in size. No pericardial effusion. The aorta is within normal limits in caliber. Scattered atherosclerotic calcifications. Significant age advanced three-vessel coronary artery calcifications and some calcifications around the aortic valve and mitral valve annulus.  CTPE 10/20/21 Cardiovascular: There is no pulmonary embolism identified within the main, lobar or segmental pulmonary arteries bilaterally.   No thoracic aortic aneurysm or evidence of aortic dissection. No pericardial effusion. Three-vessel coronary artery calcifications. --on my review, TAA stable at 4.1  CT chest 03/12/21 Atherosclerosis of thoracic aorta is noted. 4.1 cm ascending thoracic aortic aneurysm is noted. Normal cardiac size. No pericardial effusion. Coronary artery calcifications are noted.  CT chest 09/18/20 Cardiovascular: Normal heart size. No significant pericardial effusion/thickening. Three-vessel coronary atherosclerosis. Atherosclerotic thoracic aorta with ectatic 4.0 cm ascending thoracic aorta, stable. Normal caliber pulmonary arteries  CT chest 05/30/20 Cardiovascular: No significant vascular findings. Normal heart size. No pericardial effusion. No definite thoracic aortic aneurysm, although evaluation of the ascending aorta is limited by motion artifact. Coronary, aortic arch, and branch vessel atherosclerotic vascular disease.  EKG:  EKG is personally reviewed.   05/23/21 NSR at 82 bpm  Recent Labs: 10/20/2021: BUN 9; Creatinine, Ser 0.67; Hemoglobin 12.7; Platelets 243; Potassium 3.7; Sodium 143  Recent Lipid Panel    Component Value Date/Time   CHOL  145 08/22/2021 0938   TRIG 111 08/22/2021 0938   HDL 50 08/22/2021 0938   CHOLHDL 2.9 08/22/2021 0938   LDLCALC 75 08/22/2021 0938    Physical Exam:    VS:  BP 110/75 (BP Location: Left Arm, Patient Position: Sitting, Cuff Size: Normal)   Pulse 71   Ht $R'5\' 5"'Ya$  (1.651 m)   Wt 186 lb 14.4 oz (84.8 kg)   LMP 04/03/2015   SpO2 98%   BMI 31.10 kg/m     Wt Readings from Last 3 Encounters:  04/10/22 186 lb 14.4 oz (84.8 kg)  03/31/22 187 lb (84.8 kg)  10/20/21 187 lb (84.8 kg)    GEN: Well nourished, well developed in no acute distress HEENT: Normal, moist mucous membranes NECK: No JVD CARDIAC: regular rhythm, normal S1 and S2, no rubs or gallops. 2/6 systolic murmur. VASCULAR: Radial and DP pulses 2+ bilaterally. No carotid bruits RESPIRATORY:  Clear to auscultation without rales, wheezing or rhonchi  ABDOMEN: Soft, non-tender, non-distended MUSCULOSKELETAL:  Ambulates independently SKIN: Warm and dry, no edema NEUROLOGIC:  Alert and oriented x 3. No focal neuro deficits noted. PSYCHIATRIC:  Normal affect    ASSESSMENT:    1. Aneurysm of ascending aorta without rupture ([place])   2. Coronary artery calcification seen on CT scan   3. Aortic atherosclerosis ([facility])   4. Cardiac risk counseling   5. Nutritional counseling   6. Counseling on  health promotion and disease prevention   7. Exercise counseling   8. Murmur, cardiac   9. Pre-procedure lab exam     PLAN:    Murmur Thoracic aortic aneurysm -AoV calcification but it is a tricuspid leaflet valve. -TAA stable on imaging earlier this year (41 mm) -repeat 1 year, study ordered  Coronary artery calcification Aortic atherosclerosis Hypercholesterolemia -tolerating rosuvastatin  Cardiac risk counseling and prevention recommendations: d -recommend heart healthy/Mediterranean diet, with whole grains, fruits, vegetable, fish, lean meats, nuts, and olive oil. Limit salt. -recommend moderate walking, 3-5 times/week for 30-50  minutes each session. Aim for at least 150 minutes.week. Goal should be pace of 3 miles/hours, or walking 1.5 miles in 30 minutes -recommend avoidance of tobacco products. Avoid excess alcohol.  Plan for follow up: 1 year or sooner as needed  Buford Dresser, MD, PhD, [facility]    Medication Adjustments/Labs and Tests Ordered: Current medicines are reviewed at length with the patient today.  Concerns regarding medicines are outlined above.  Orders Placed This Encounter  Procedures   CT ANGIO CHEST AORTA W/CM & OR WO/CM   Basic metabolic panel    No orders of the defined types were placed in this encounter.    Patient Instructions  Medication Instructions:  Your Physician recommend you continue on your current medication as directed.    *If you need a refill on your cardiac medications before your next appointment, please call your pharmacy*   Lab Work: Your provider has recommended lab work in 1 year prior to CTA (BMP). Please have this collected at [facility]. The lab is open 8:00 am - 4:30 pm. Please avoid 12:00p - 1:00p for lunch hour. You do not need an appointment. Please go to [address] [place], [place] [address]. This is in the Primary Care office on the 3rd floor, let them know you are there for blood work and they will direct you to the lab.  If you have labs (blood work) drawn today and your tests are completely normal, you will receive your results only by: [place] (if you have MyChart) OR A paper copy in the mail If you have any lab test that is abnormal or we need to change your treatment, we will call you to review the results.   Testing/Procedures: CT Chest Angiography (CTA) in 1 year, is a special type of CT scan that uses a computer to produce multi-dimensional views of major blood vessels throughout the body. In CT angiography, a contrast material is injected through an IV to help  visualize the blood vessels    Follow-Up: At [facility], you and your health needs are our priority.  As part of our continuing mission to provide you with exceptional heart care, we have created designated Provider Care Teams.  These Care Teams include your primary Cardiologist (physician) and Advanced Practice Providers (APPs -  Physician Assistants and Nurse Practitioners) who all work together to provide you with the care you need, when you need it.  We recommend signing up for the patient portal called "MyChart".  Sign up information is provided on this After Visit Summary.  MyChart is used to connect with patients for Virtual Visits (Telemedicine).  Patients are able to view lab/test results, encounter notes, upcoming appointments, etc.  Non-urgent messages can be sent to your provider as well.   To learn more about what you can do with MyChart, go to [email or web address].  Your next appointment:   1 year(s)  The format for your next appointment:   In Person  Provider:   Buford Dresser, MD{        Signed, Buford Dresser, MD PhD 04/11/2022     [place]

## 2022-04-10 NOTE — Patient Instructions (Signed)
Medication Instructions:  Your Physician recommend you continue on your current medication as directed.    *If you need a refill on your cardiac medications before your next appointment, please call your pharmacy*   Lab Work: Your provider has recommended lab work in 1 year prior to CTA (BMP). Please have this collected at [facility]. The lab is open 8:00 am - 4:30 pm. Please avoid 12:00p - 1:00p for lunch hour. You do not need an appointment. Please go to [address] [place], [place] [address]. This is in the Primary Care office on the 3rd floor, let them know you are there for blood work and they will direct you to the lab.  If you have labs (blood work) drawn today and your tests are completely normal, you will receive your results only by: [place] (if you have MyChart) OR A paper copy in the mail If you have any lab test that is abnormal or we need to change your treatment, we will call you to review the results.   Testing/Procedures: CT Chest Angiography (CTA) in 1 year, is a special type of CT scan that uses a computer to produce multi-dimensional views of major blood vessels throughout the body. In CT angiography, a contrast material is injected through an IV to help visualize the blood vessels    Follow-Up: At [facility], you and your health needs are our priority.  As part of our continuing mission to provide you with exceptional heart care, we have created designated Provider Care Teams.  These Care Teams include your primary Cardiologist (physician) and Advanced Practice Providers (APPs -  Physician Assistants and Nurse Practitioners) who all work together to provide you with the care you need, when you need it.  We recommend signing up for the patient portal called "MyChart".  Sign up information is provided on this After Visit Summary.  MyChart is used to connect with patients for Virtual Visits (Telemedicine).  Patients are able  to view lab/test results, encounter notes, upcoming appointments, etc.  Non-urgent messages can be sent to your provider as well.   To learn more about what you can do with MyChart, go to [email or web address].    Your next appointment:   1 year(s)  The format for your next appointment:   In Person  Provider:   Buford Dresser, MD{

## 2022-04-11 ENCOUNTER — Encounter (HOSPITAL_BASED_OUTPATIENT_CLINIC_OR_DEPARTMENT_OTHER): Payer: Self-pay | Admitting: Cardiology

## 2022-05-26 ENCOUNTER — Encounter (HOSPITAL_BASED_OUTPATIENT_CLINIC_OR_DEPARTMENT_OTHER): Payer: Self-pay

## 2022-05-26 DIAGNOSIS — I2510 Atherosclerotic heart disease of native coronary artery without angina pectoris: Secondary | ICD-10-CM

## 2022-05-26 MED ORDER — ROSUVASTATIN CALCIUM 20 MG PO TABS: 20.0000 mg | ORAL_TABLET | Freq: Every day | ORAL | 3 refills | Status: DC

## 2022-07-04 MED ORDER — METHOTREXATE SODIUM 2.5 MG TABLET
ORAL_TABLET | ORAL | 1 refills | 84 days | Status: CP
Start: 2022-07-04 — End: ?

## 2022-07-17 ENCOUNTER — Telehealth: Payer: Self-pay

## 2022-07-17 NOTE — Telephone Encounter (Signed)
1st attempt to reach pt to schedule tele visit. Lvm  

## 2022-07-17 NOTE — Telephone Encounter (Signed)
   Pre-operative Risk Assessment    Patient Name: Carla Holloway  DOB: August 30, 1961 MRN: [id number]     Request for Surgical Clearance    Procedure:  Endoscopy/ Colonoscopy   Date of Surgery:  Clearance TBD                                 Surgeon:  Dr. Arta Silence  Surgeon's Group or Practice Name:  [facility]  Phone number:  [phone] Fax number:  [phone]   Type of Clearance Requested:   - Medical    Type of Anesthesia:  Not Indicated   Additional requests/questions:    Oneal Grout   07/17/2022, 3:48 PM

## 2022-07-17 NOTE — Telephone Encounter (Signed)
   Name: Carla Holloway  DOB: Apr 27, 1961  MRN: [id number]  Primary Cardiologist: Buford Dresser, MD   Preoperative team, please contact this patient and set up a phone call appointment for further preoperative risk assessment. Please obtain consent and complete medication review. Thank you for your help.  I confirm that guidance regarding antiplatelet and oral anticoagulation therapy has been completed and, if necessary, noted below (none requested).   Lenna Sciara, NP 07/17/2022, [place]

## 2022-07-18 ENCOUNTER — Telehealth: Payer: Self-pay

## 2022-07-18 ENCOUNTER — Telehealth: Payer: Self-pay | Admitting: Cardiology

## 2022-07-18 NOTE — Telephone Encounter (Signed)
Pt scheduled for a tele visit on 07/25/22. Med rec and consent done

## 2022-07-18 NOTE — Telephone Encounter (Signed)
  Patient Consent for Virtual Visit         Carla Holloway has provided verbal consent on 07/18/2022 for a virtual visit (video or telephone).   CONSENT FOR VIRTUAL VISIT FOR:  Carla Holloway  By participating in this virtual visit I agree to the following:  I hereby voluntarily request, consent and authorize [facility] and its employed or contracted physicians, physician assistants, nurse practitioners or other licensed health care professionals (the Practitioner), to provide me with telemedicine health care services (the "Services") as deemed necessary by the treating Practitioner. I acknowledge and consent to receive the Services by the Practitioner via telemedicine. I understand that the telemedicine visit will involve communicating with the Practitioner through live audiovisual communication technology and the disclosure of certain medical information by electronic transmission. I acknowledge that I have been given the opportunity to request an in-person assessment or other available alternative prior to the telemedicine visit and am voluntarily participating in the telemedicine visit.  I understand that I have the right to withhold or withdraw my consent to the use of telemedicine in the course of my care at any time, without affecting my right to future care or treatment, and that the Practitioner or I may terminate the telemedicine visit at any time. I understand that I have the right to inspect all information obtained and/or recorded in the course of the telemedicine visit and may receive copies of available information for a reasonable fee.  I understand that some of the potential risks of receiving the Services via telemedicine include:  Delay or interruption in medical evaluation due to technological equipment failure or disruption; Information transmitted may not be sufficient (e.g. poor resolution of images) to allow for appropriate medical decision making by the  Practitioner; and/or  In rare instances, security protocols could fail, causing a breach of personal health information.  Furthermore, I acknowledge that it is my responsibility to provide information about my medical history, conditions and care that is complete and accurate to the best of my ability. I acknowledge that Practitioner's advice, recommendations, and/or decision may be based on factors not within their control, such as incomplete or inaccurate data provided by me or distortions of diagnostic images or specimens that may result from electronic transmissions. I understand that the practice of medicine is not an exact science and that Practitioner makes no warranties or guarantees regarding treatment outcomes. I acknowledge that a copy of this consent can be made available to me via my patient portal ([place]), or I can request a printed copy by calling the office of [facility].    I understand that my insurance will be billed for this visit.   I have read or had this consent read to me. I understand the contents of this consent, which adequately explains the benefits and risks of the Services being provided via telemedicine.  I have been provided ample opportunity to ask questions regarding this consent and the Services and have had my questions answered to my satisfaction. I give my informed consent for the services to be provided through the use of telemedicine in my medical care

## 2022-07-18 NOTE — Telephone Encounter (Signed)
Pt returning call to sch preop tele visit.

## 2022-07-21 ENCOUNTER — Telehealth: Payer: Self-pay | Admitting: Pulmonary Disease

## 2022-07-21 NOTE — Telephone Encounter (Signed)
Fax received from Dr. Arta Silence with [facility] to perform a Colonoscopy on patient.  Patient needs surgery clearance. Surgery is PENDING. Patient was seen on 03/31/2022. Office protocol is a risk assessment can be sent to surgeon if patient has been seen in 60 days or less.   Sending to Dr Ander Slade for risk assessment or recommendations if patient needs to be seen in office prior to surgical procedure.

## 2022-07-23 ENCOUNTER — Ambulatory Visit: Admit: 2022-07-23 | Discharge: 2022-07-24 | Payer: PRIVATE HEALTH INSURANCE | Attending: Family | Primary: Family

## 2022-07-23 DIAGNOSIS — Z23 Encounter for immunization: Principal | ICD-10-CM

## 2022-07-23 DIAGNOSIS — Z79631 Methotrexate, long term, current use: Principal | ICD-10-CM

## 2022-07-23 DIAGNOSIS — M059 Rheumatoid arthritis with rheumatoid factor, unspecified: Principal | ICD-10-CM

## 2022-07-23 MED ORDER — HYDROXYCHLOROQUINE 200 MG TABLET
ORAL_TABLET | Freq: Two times a day (BID) | ORAL | 3 refills | 90 days | Status: CP
Start: 2022-07-23 — End: ?

## 2022-07-23 NOTE — Telephone Encounter (Signed)
Clear to have surgery from a respiratory perspective  Was not having any acutely reversible symtoms or conditions to preclude surgery  Has OSA and is compliant with CPAP

## 2022-07-23 NOTE — Telephone Encounter (Signed)
OV notes and clearance form have been faxed back to [facility]. Nothing further needed at this time.

## 2022-07-25 ENCOUNTER — Encounter: Payer: Self-pay | Admitting: Nurse Practitioner

## 2022-07-25 ENCOUNTER — Ambulatory Visit
Payer: No Typology Code available for payment source | Attending: Interventional Cardiology | Admitting: Nurse Practitioner

## 2022-07-25 DIAGNOSIS — Z01810 Encounter for preprocedural cardiovascular examination: Secondary | ICD-10-CM | POA: Diagnosis not present

## 2022-07-25 NOTE — Progress Notes (Signed)
Virtual Visit via Telephone Note   Because of Carla Holloway's co-morbid illnesses, she is at least at moderate risk for complications without adequate follow up.  This format is felt to be most appropriate for this patient at this time.  The patient did not have access to video technology/had technical difficulties with video requiring transitioning to audio format only (telephone).  All issues noted in this document were discussed and addressed.  No physical exam could be performed with this format.  Please refer to the patient's chart for her consent to telehealth for [facility].  Evaluation Performed:  Preoperative cardiovascular risk assessment _____________   Date:  07/25/2022   Patient ID:  Carla Holloway, DOB 04-25-1961, MRN [id number] Patient Location:  Home Provider location:   Office  Primary Care Provider:  Shirline Frees, MD Primary Cardiologist:  Buford Dresser, MD  Chief Complaint / Patient Profile   61 y.o. y/o female with a h/o interstitial lung disease, thoracic aortic aneurysm, HLD, metabolic syndrome, OSA on CPAP, coronary and aortic atherosclerosis on imaging who is pending colonoscopy and presents today for telephonic preoperative cardiovascular risk assessment.  Past Medical History    Past Medical History:  Diagnosis Date   Melanoma ([place])    Thyroid disease    Past Surgical History:  Procedure Laterality Date   BREAST REDUCTION SURGERY     melanoma removal     back     Allergies  Allergies  Allergen Reactions   Topiramate Other (See Comments)    Vision issue Tingling in fingers and blurry vision Vision changes and neuropathy    Atorvastatin Nausea And Vomiting, Nausea Only and Other (See Comments)   Codeine Nausea And Vomiting and Nausea Only   Tramadol Nausea Only    History of Present Illness    Carla Holloway is a 61 y.o. female who presents via audio/video conferencing for a telehealth visit today.  Pt was last  seen in cardiology clinic on 04/10/2022 by Dr. Harrell Gave.  At that time Carla Holloway was doing well.  The patient is now pending procedure as outlined above. Since her last visit, she  denies chest pain, shortness of breath, lower extremity edema, fatigue, palpitations, melena, hematuria, hemoptysis, diaphoresis, weakness, presyncope, syncope, orthopnea, and PND. She remains active with walking, lifting weights, and is up and down a flight of stairs multiple times per day.   Home Medications    Prior to Admission medications   Medication Sig Start Date End Date Taking? Authorizing Provider  Adalimumab (HUMIRA PEN) 40 MG/0.4ML PNKT Inject into the skin. 03/28/22   [provider]  Ascorbic Acid (VITAMIN C) 500 MG CAPS     [provider]  Calcium Polycarbophil (FIBER-CAPS PO)     [provider]  Calcium-Magnesium-Vitamin D (CALCIUM 1200+D3 PO)     [provider]  docusate sodium (COLACE) 100 MG capsule Take 100 mg by mouth 2 (two) times daily.    [provider]  Fexofenadine HCl (ALLEGRA PO) Take 1 tablet by mouth daily.    [provider]  hydroxychloroquine (PLAQUENIL) 200 MG tablet Take 200 mg by mouth 2 (two) times daily. 08/05/20   [provider]  Ibuprofen 200 MG CAPS     [provider]  levothyroxine (SYNTHROID) 175 MCG tablet levothyroxine 175 mcg tablet  TAKE 1 TABLET BY MOUTH EVERY DAY 07/09/14   [provider]  methotrexate (RHEUMATREX) 2.5 MG tablet Take 20 mg by mouth once a week.  02/13/21   [provider]  Omega-3 Fatty Acids (FISH OIL PO) Take 1 capsule by mouth daily.    [provider]  rosuvastatin (CRESTOR) 20 MG tablet Take 1 tablet (20 mg total) by mouth daily. 05/26/22 05/21/23  Buford Dresser, MD    Physical Exam    Vital Signs:  Carla Holloway does not have vital signs available for review today.  Given telephonic nature of communication, physical exam is  limited. AAOx3. NAD. Normal affect.  Speech and respirations are unlabored.  Accessory Clinical Findings    None  Assessment & Plan    1.  Preoperative Cardiovascular Risk Assessment: The patient is doing well from a cardiac perspective. Therefore, based on ACC/AHA guidelines, the patient would be at acceptable risk for the planned procedure without further cardiovascular testing. According to the Revised Cardiac Risk Index (RCRI), her Perioperative Risk of Major Cardiac Event is (%): 0.4 Her Functional Capacity in METs is: 7.59 according to the Duke Activity Status Index (DASI).  The patient was advised that if she develops new symptoms prior to surgery to contact our office to arrange for a follow-up visit, and she verbalized understanding.  No cardiac medications requested to be held.  A copy of this note will be routed to requesting surgeon.  Time:   Today, I have spent 7 minutes with the patient with telehealth technology discussing medical history, symptoms, and management plan.     Emmaline Life, NP-C  07/25/2022, 2:39 PM [address], [address] Office [phone] Fax [phone]

## 2022-10-22 DIAGNOSIS — M059 Rheumatoid arthritis with rheumatoid factor, unspecified: Principal | ICD-10-CM

## 2022-10-22 MED ORDER — HADLIMA(CF) PUSHTOUCH 40 MG/0.4 ML SUBCUTANEOUS AUTO-INJECTOR
SUBCUTANEOUS | 3 refills | 0 days | Status: CP
Start: 2022-10-22 — End: 2023-10-22

## 2022-10-31 ENCOUNTER — Other Ambulatory Visit: Payer: Self-pay | Admitting: Gastroenterology

## 2022-11-13 DIAGNOSIS — M059 Rheumatoid arthritis with rheumatoid factor, unspecified: Principal | ICD-10-CM

## 2022-11-13 MED ORDER — PREDNISONE 1 MG TABLET
ORAL_TABLET | 3 refills | 0 days
Start: 2022-11-13 — End: ?

## 2022-12-02 ENCOUNTER — Ambulatory Visit: Admit: 2022-12-02 | Discharge: 2022-12-03 | Payer: PRIVATE HEALTH INSURANCE

## 2022-12-02 DIAGNOSIS — M059 Rheumatoid arthritis with rheumatoid factor, unspecified: Principal | ICD-10-CM

## 2022-12-02 DIAGNOSIS — Z79631 Methotrexate, long term, current use: Principal | ICD-10-CM

## 2022-12-02 MED ORDER — TUBERCULIN SYRINGE 1 ML 25 GAUGE X 5/8"
3 refills | 0 days | Status: CP
Start: 2022-12-02 — End: ?

## 2022-12-02 MED ORDER — METHOTREXATE SODIUM (CONTAINS PRESERVATIVES) 25 MG/ML INJECTION SOLUTION
SUBCUTANEOUS | 3 refills | 70 days | Status: CP
Start: 2022-12-02 — End: ?

## 2022-12-02 MED ORDER — HYDROXYCHLOROQUINE 200 MG TABLET
ORAL_TABLET | Freq: Two times a day (BID) | ORAL | 3 refills | 90 days | Status: CP
Start: 2022-12-02 — End: ?

## 2022-12-10 ENCOUNTER — Encounter (HOSPITAL_COMMUNITY): Payer: Self-pay | Admitting: Gastroenterology

## 2022-12-16 NOTE — Anesthesia Preprocedure Evaluation (Signed)
Anesthesia Evaluation  Patient identified by MRN, date of birth, ID band Patient awake    Reviewed: Allergy & Precautions, NPO status , Patient's Chart, lab work & pertinent test results  Airway Mallampati: II  TM Distance: >3 FB Neck ROM: Full    Dental no notable dental hx. (+) Teeth Intact, Dental Advisory Given   Pulmonary sleep apnea and Continuous Positive Airway Pressure Ventilation  Hx of interstial lung Disease   Pulmonary exam normal breath sounds clear to auscultation       Cardiovascular + Peripheral Vascular Disease (Thoracic aneurysm)  Normal cardiovascular exam Rhythm:Regular Rate:Normal  05/2021 TTE 1. Left ventricular ejection fraction, by estimation, is 60 to 65%. The  left ventricle has normal function. The left ventricle has no regional  wall motion abnormalities. Left ventricular diastolic parameters are  consistent with Grade I diastolic  dysfunction (impaired relaxation).   2. Right ventricular systolic function is normal. The right ventricular  size is normal.   3. The mitral valve is normal in structure. No evidence of mitral valve  regurgitation. No evidence of mitral stenosis.   4. Moderate calcification of right coronary cusp. The aortic valve is  tricuspid. Aortic valve regurgitation is mild. Mild to moderate aortic  valve sclerosis/calcification is present, without any evidence of aortic  stenosis.   5. Aortic Borderline dilatation. There is borderline dilatation of the  ascending aorta, measuring 39 mm.   6. The inferior vena cava is normal in size with greater than 50%  respiratory variability, suggesting right atrial pressure of 3 mmHg.   Comparison(s): Aorta stable when compared to prior measure of 4.0 cm.     Neuro/Psych    GI/Hepatic   Endo/Other  Hypothyroidism    Renal/GU      Musculoskeletal  (+) Arthritis , Rheumatoid disorders,    Abdominal   Peds  Hematology    Anesthesia Other Findings Allergies : codeine, Simvastatin atorvastatin, Tramadol, Tape, Topiramate  Reproductive/Obstetrics                             Anesthesia Physical Anesthesia Plan  ASA: 3  Anesthesia Plan: MAC   Post-op Pain Management:    Induction:   PONV Risk Score and Plan: Treatment may vary due to age or medical condition and Propofol infusion  Airway Management Planned: Nasal Cannula and Natural Airway  Additional Equipment: None  Intra-op Plan:   Post-operative Plan:   Informed Consent: I have reviewed the patients History and Physical, chart, labs and discussed the procedure including the risks, benefits and alternatives for the proposed anesthesia with the patient or authorized representative who has indicated his/her understanding and acceptance.     Dental advisory given  Plan Discussed with:   Anesthesia Plan Comments: (HX of Colon Polyps for colonoscopy)        Anesthesia Quick Evaluation

## 2022-12-17 ENCOUNTER — Ambulatory Visit (HOSPITAL_BASED_OUTPATIENT_CLINIC_OR_DEPARTMENT_OTHER): Payer: No Typology Code available for payment source | Admitting: Anesthesiology

## 2022-12-17 ENCOUNTER — Ambulatory Visit (HOSPITAL_COMMUNITY): Payer: No Typology Code available for payment source | Admitting: Anesthesiology

## 2022-12-17 ENCOUNTER — Other Ambulatory Visit: Payer: Self-pay

## 2022-12-17 ENCOUNTER — Encounter (HOSPITAL_COMMUNITY): Payer: Self-pay | Admitting: Gastroenterology

## 2022-12-17 ENCOUNTER — Encounter (HOSPITAL_COMMUNITY)
Admission: RE | Disposition: A | Discharge status: Home / Self Care | Payer: Self-pay | Source: Ambulatory Visit | Attending: Gastroenterology

## 2022-12-17 ENCOUNTER — Ambulatory Visit (HOSPITAL_COMMUNITY)
Admission: RE | Admit: 2022-12-17 | Discharge: 2022-12-17 | Disposition: A | Discharge status: Home / Self Care | Payer: No Typology Code available for payment source | Source: Ambulatory Visit | Attending: Gastroenterology | Admitting: Gastroenterology

## 2022-12-17 DIAGNOSIS — Z9989 Dependence on other enabling machines and devices: Secondary | ICD-10-CM

## 2022-12-17 DIAGNOSIS — I712 Thoracic aortic aneurysm, without rupture, unspecified: Secondary | ICD-10-CM | POA: Diagnosis not present

## 2022-12-17 DIAGNOSIS — K5730 Diverticulosis of large intestine without perforation or abscess without bleeding: Secondary | ICD-10-CM | POA: Diagnosis not present

## 2022-12-17 DIAGNOSIS — I739 Peripheral vascular disease, unspecified: Secondary | ICD-10-CM | POA: Insufficient documentation

## 2022-12-17 DIAGNOSIS — Z86010 Personal history of colonic polyps: Secondary | ICD-10-CM | POA: Diagnosis not present

## 2022-12-17 DIAGNOSIS — G4730 Sleep apnea, unspecified: Secondary | ICD-10-CM | POA: Insufficient documentation

## 2022-12-17 DIAGNOSIS — E039 Hypothyroidism, unspecified: Secondary | ICD-10-CM | POA: Diagnosis not present

## 2022-12-17 DIAGNOSIS — Z1211 Encounter for screening for malignant neoplasm of colon: Secondary | ICD-10-CM | POA: Insufficient documentation

## 2022-12-17 DIAGNOSIS — G4733 Obstructive sleep apnea (adult) (pediatric): Secondary | ICD-10-CM

## 2022-12-17 DIAGNOSIS — K648 Other hemorrhoids: Secondary | ICD-10-CM

## 2022-12-17 HISTORY — PX: COLONOSCOPY WITH PROPOFOL: SHX5780

## 2022-12-17 SURGERY — COLONOSCOPY WITH PROPOFOL
Anesthesia: Monitor Anesthesia Care | Laterality: Bilateral

## 2022-12-17 MED ORDER — LACTATED RINGERS IV SOLN: INTRAVENOUS | Status: DC | PRN

## 2022-12-17 MED ORDER — PROPOFOL 10 MG/ML IV BOLUS
INTRAVENOUS | Status: DC | PRN
  Administered 2022-12-17: 90 ug/kg/min via INTRAVENOUS

## 2022-12-17 MED ORDER — PROPOFOL 500 MG/50ML IV EMUL
INTRAVENOUS | Status: AC
  Filled 2022-12-17: qty 50

## 2022-12-17 MED ORDER — SODIUM CHLORIDE 0.9 % IV SOLN: INTRAVENOUS | Status: DC

## 2022-12-17 MED ORDER — LIDOCAINE HCL (PF) 2 % IJ SOLN
INTRAMUSCULAR | Status: DC | PRN
  Administered 2022-12-17: 60 mg via INTRADERMAL

## 2022-12-17 MED ORDER — PROPOFOL 500 MG/50ML IV EMUL
INTRAVENOUS | Status: DC | PRN
  Administered 2022-12-17: 50 mg via INTRAVENOUS

## 2022-12-17 MED ORDER — LACTATED RINGERS IV SOLN: Freq: Once | INTRAVENOUS | Status: AC

## 2022-12-17 MED ORDER — BIOTIN 2500 MCG PO CAPS: 2500.0000 ug | ORAL_CAPSULE | Freq: Every day | ORAL | Status: AC

## 2022-12-17 SURGICAL SUPPLY — 22 items

## 2022-12-17 NOTE — Op Note (Signed)
[facility] Patient Name: Carla Holloway Procedure Date: 12/17/2022 MRN: [id number] Attending MD: Arta Silence , MD, [id number] Date of Birth: 01-25-1961 CSN: [id number] Age: 62 Admit Type: Outpatient Procedure:                Colonoscopy Indications:              High risk colon cancer surveillance: Personal                            history of colonic polyps, Last colonoscopy: 2018 Providers:                Arta Silence, MD, Katina Degree, RN, Darliss Cheney,                            Technician Referring MD:              Medicines:                Monitored Anesthesia Care Complications:            No immediate complications. Estimated Blood Loss:     Estimated blood loss: none. Procedure:                Pre-Anesthesia Assessment:                           - Prior to the procedure, a History and Physical                            was performed, and patient medications and                            allergies were reviewed. The patient's tolerance of                            previous anesthesia was also reviewed. The risks                            and benefits of the procedure and the sedation                            options and risks were discussed with the patient.                            All questions were answered, and informed consent                            was obtained. Prior Anticoagulants: The patient has                            taken no anticoagulant or antiplatelet agents. ASA                            Grade Assessment: III - A patient with severe  systemic disease. After reviewing the risks and                            benefits, the patient was deemed in satisfactory                            condition to undergo the procedure.                           After obtaining informed consent, the colonoscope                            was passed under direct vision. Throughout the                             procedure, the patient's blood pressure, pulse, and                            oxygen saturations were monitored continuously. The                            [id number]) Olympus colonoscope was                            introduced through the anus and advanced to the the                            cecum, identified by appendiceal orifice and                            ileocecal valve. The ileocecal valve, appendiceal                            orifice, and rectum were photographed. The entire                            colon was examined. The colonoscopy was performed                            without difficulty. The patient tolerated the                            procedure well. The quality of the bowel                            preparation was adequate. Scope In: 9:35:30 AM Scope Out: 9:51:18 AM Scope Withdrawal Time: 0 hours 11 minutes 42 seconds  Total Procedure Duration: 0 hours 15 minutes 48 seconds  Findings:      Hemorrhoids were found on perianal exam.      Internal hemorrhoids were found during retroflexion. The hemorrhoids       were medium-sized.      No additional abnormalities were found on retroflexion.      A few small-mouthed diverticula were found in the sigmoid colon.      The  exam was otherwise without abnormality on direct and retroflexion       views. Impression:               - Hemorrhoids found on perianal exam.                           - Internal hemorrhoids.                           - Diverticulosis in the sigmoid colon.                           - The examination was otherwise normal on direct                            and retroflexion views.                           - No specimens collected. Moderate Sedation:      None Recommendation:           - Patient has a contact number available for                            emergencies. The signs and symptoms of potential                            delayed complications were discussed with the                             patient. Return to normal activities tomorrow.                            Written discharge instructions were provided to the                            patient.                           - Discharge patient to home (via wheelchair).                           - High fiber diet indefinitely.                           - Continue present medications.                           - Repeat colonoscopy in 10 years for surveillance                            (two consecutive exams at 5-year intervals both                            without polyps seen).                           -  Return to GI clinic PRN.                           - Return to referring physician as previously                            scheduled. Procedure Code(s):        --- Professional ---                           [phone], Colonoscopy, flexible; diagnostic, including                            collection of specimen(s) by brushing or washing,                            when performed (separate procedure) Diagnosis Code(s):        --- Professional ---                           Z86.010, Personal history of colonic polyps                           K64.8, Other hemorrhoids                           K57.30, Diverticulosis of large intestine without                            perforation or abscess without bleeding CPT copyright 2022 American Medical Association. All rights reserved. The codes documented in this report are preliminary and upon coder review may  be revised to meet current compliance requirements. Arta Silence, MD 12/17/2022 10:00:25 AM This report has been signed electronically. Number of Addenda: 0

## 2022-12-17 NOTE — Anesthesia Postprocedure Evaluation (Signed)
Anesthesia Post Note  Patient: Carla Holloway  Procedure(s) Performed: COLONOSCOPY WITH PROPOFOL (Bilateral)     Patient location during evaluation: Endoscopy Anesthesia Type: MAC Level of consciousness: awake and alert Pain management: pain level controlled Vital Signs Assessment: post-procedure vital signs reviewed and stable Respiratory status: spontaneous breathing, nonlabored ventilation, respiratory function stable and patient connected to nasal cannula oxygen Cardiovascular status: blood pressure returned to baseline and stable Postop Assessment: no apparent nausea or vomiting Anesthetic complications: no  No notable events documented.  Last Vitals:  Vitals:   12/17/22 1007 12/17/22 1016  BP: 120/72 (!) 130/92  Pulse: 65 64  Resp: 14 15  Temp:    SpO2: 100% 100%    Last Pain:  Vitals:   12/17/22 1016  TempSrc:   PainSc: 0-No pain                 Barnet Glasgow

## 2022-12-17 NOTE — H&P (Signed)
[facility] H/P Note  Chief Complaint: Personal history of colonic polyps  HPI: Carla Holloway is an 62 y.o. female.  Presents for surveillance colonoscopy.  Personal history of colonic polyps.  Last colonoscopy 2018.  She has no abdominal pain, change in bowel habits, blood in stool.  Past Medical History:  Diagnosis Date   Melanoma    Thyroid disease     Past Surgical History:  Procedure Laterality Date   BREAST REDUCTION SURGERY     melanoma removal     back     Medications Prior to Admission  Medication Sig Dispense Refill   Adalimumab (HUMIRA PEN) 40 MG/0.4ML PNKT Inject 40 mg into the skin every 14 (fourteen) days.     Ascorbic Acid (VITAMIN C) 1000 MG tablet Take 1,000 mg by mouth daily.     Biotin 2500 MCG CAPS Take 2,500 mg by mouth daily.     Calcium Polycarbophil (FIBER-CAPS PO) Take 3 tablets by mouth daily.     Calcium-Magnesium-Vitamin D (CALCIUM 1200+D3 PO) Take 2 tablets by mouth daily. 5000 units     Cyanocobalamin (VITAMIN B-12) 3000 MCG SUBL Place 3,000 mcg under the tongue daily.     docusate sodium (COLACE) 50 MG capsule Take 100 mg by mouth 2 (two) times daily.     Fexofenadine HCl (ALLEGRA PO) Take 180 mg by mouth daily.     folic acid (FOLVITE) 1 MG tablet Take 1 mg by mouth daily.     hydroxychloroquine (PLAQUENIL) 200 MG tablet Take 200 mg by mouth 2 (two) times daily.     levothyroxine (SYNTHROID) 175 MCG tablet Take 175 mcg by mouth daily before breakfast.     magnesium gluconate (MAGONATE) 500 MG tablet Take 500 mg by mouth daily.     Methotrexate Sodium (METHOTREXATE, PF,) 50 MG/2ML injection Inject 1 mL into the muscle once a week.     Omega-3 Fatty Acids (FISH OIL PO) Take 2 capsules by mouth 2 (two) times daily. Super     rosuvastatin (CRESTOR) 20 MG tablet Take 1 tablet (20 mg total) by mouth daily. 90 tablet 3   TURMERIC-GINGER PO Take 3 tablets by mouth daily. w /Bioperine 1950 mg each      Allergies:  Allergies  Allergen  Reactions   Topiramate Other (See Comments)    Vision issue Tingling in fingers and blurry vision Vision changes and neuropathy    Simvastatin Other (See Comments)    Muscle ache   Atorvastatin Other (See Comments)    Muscle ache   Codeine Nausea And Vomiting and Nausea Only   Tape Rash and Other (See Comments)    blister   Tramadol Nausea Only    History reviewed. No pertinent family history.  Social History:  reports that she has never smoked. She has never used smokeless tobacco. She reports current alcohol use. She reports that she does not use drugs.   ROS: As per HPI, all others negative   Blood pressure 130/85, pulse 70, temperature 97.7 F (36.5 C), temperature source Temporal, resp. rate 12, height 5\' 5"  (1.651 m), weight 83.9 kg, last menstrual period 04/03/2015, SpO2 100 %. General appearance: NAD HEENT:  [place]/AT, anicteric NECK:  Supple CV:  Regular RESP:  No labored breathing ABD:  Soft, non-tender NEURO:  A/O, non-focal, no encephalopathy  No results found for this or any previous visit (from the past 48 hour(s)). No results found.  Assessment/Plan   Personal history of colonic polyps. Sleep apnea and thoracic aortic  aneurysm. Colonoscopy for colon polyp surveillance. Risks (bleeding, infection, bowel perforation that could require surgery, sedation-related changes in cardiopulmonary systems), benefits (identification and possible treatment of source of symptoms, exclusion of certain causes of symptoms), and alternatives (watchful waiting, radiographic imaging studies, empiric medical treatment) of colonoscopy were explained to patient/family in detail and patient wishes to proceed.   Landry Dyke 12/17/2022, 9:17 AM

## 2022-12-17 NOTE — Transfer of Care (Signed)
Immediate Anesthesia Transfer of Care Note  Patient: Carla Holloway  Procedure(s) Performed: COLONOSCOPY WITH PROPOFOL (Bilateral)  Patient Location: PACU and Endoscopy Unit  Anesthesia Type:MAC  Level of Consciousness: awake, alert , oriented, and patient cooperative  Airway & Oxygen Therapy: Patient Spontanous Breathing and Patient connected to nasal cannula oxygen  Post-op Assessment: Report given to RN, Post -op Vital signs reviewed and stable, and Patient moving all extremities  Post vital signs: Reviewed and stable  Last Vitals:  Vitals Value Taken Time  BP 109/82 12/17/22 0957  Temp    Pulse 77 12/17/22 0959  Resp 19 12/17/22 0959  SpO2 96 % 12/17/22 0959  Vitals shown include unvalidated device data.  Last Pain:  Vitals:   12/17/22 0842  TempSrc: Temporal  PainSc: 2          Complications: No notable events documented.

## 2022-12-17 NOTE — Discharge Instructions (Signed)

## 2022-12-19 ENCOUNTER — Encounter (HOSPITAL_COMMUNITY): Payer: Self-pay | Admitting: Gastroenterology

## 2022-12-23 DIAGNOSIS — M059 Rheumatoid arthritis with rheumatoid factor, unspecified: Principal | ICD-10-CM

## 2022-12-23 MED ORDER — METHOTREXATE SODIUM 2.5 MG TABLET
ORAL_TABLET | ORAL | 1 refills | 84 days
Start: 2022-12-23 — End: ?

## 2023-01-02 DIAGNOSIS — Z79631 Methotrexate, long term, current use: Principal | ICD-10-CM

## 2023-01-02 DIAGNOSIS — M059 Rheumatoid arthritis with rheumatoid factor, unspecified: Principal | ICD-10-CM

## 2023-01-02 MED ORDER — METHOTREXATE SODIUM 2.5 MG TABLET
ORAL_TABLET | 0 refills | 0 days | Status: CP
Start: 2023-01-02 — End: ?

## 2023-01-15 ENCOUNTER — Telehealth: Payer: Self-pay | Admitting: Cardiology

## 2023-01-15 NOTE — Telephone Encounter (Signed)
Patient is requesting call back to discuss upcoming test she states that Dr. [name] is requesting she have done. Please advise.

## 2023-01-15 NOTE — Telephone Encounter (Signed)
Spoke with patient who was asking about CTA aorta/chest Dr Cristal Deer wanting in July and the CT chest high resolution pulmonary wanting [facility] and CTA should include what pulmonologist needs but ok to just get CTA aorta/chest will need to come from her.  Advised patient to reach out to pulmonologist to see CTA aorta/chest alone is ok  Message to precert for CTA aorta/chest

## 2023-02-11 ENCOUNTER — Encounter (HOSPITAL_BASED_OUTPATIENT_CLINIC_OR_DEPARTMENT_OTHER): Payer: Self-pay

## 2023-02-11 NOTE — Telephone Encounter (Signed)
Can someone look into this for me please?

## 2023-02-16 ENCOUNTER — Encounter: Payer: Self-pay | Admitting: Pulmonary Disease

## 2023-02-16 ENCOUNTER — Telehealth (HOSPITAL_BASED_OUTPATIENT_CLINIC_OR_DEPARTMENT_OTHER): Payer: Self-pay | Admitting: Cardiology

## 2023-02-16 ENCOUNTER — Other Ambulatory Visit (HOSPITAL_BASED_OUTPATIENT_CLINIC_OR_DEPARTMENT_OTHER): Payer: Self-pay | Admitting: *Deleted

## 2023-02-16 DIAGNOSIS — Z01812 Encounter for preprocedural laboratory examination: Secondary | ICD-10-CM

## 2023-02-16 NOTE — Telephone Encounter (Signed)
Dr. Wynona Neat,   Please advise on pt email:  Good afternoon,   My cardiologist, Dr. Janne Napoleon, has ordered a Ct Angio Chest Aorta W/Cm &/Or for 03/05/2023.  I also have a CT of chest ordered by Dr. Wynona Neat scheduled for 03/24/2023.  Would Dr. Wynona Neat be able to use the results of cardiologist CT scan and I be able to cancel the one on 7/9?   Cardiologist  is also with [facility].   Thank you,   Carla Holloway 01-01-1961

## 2023-02-16 NOTE — Telephone Encounter (Signed)
Spoke with patient regarding upcoming CTA chest/aorta scheduled 03/05/23 at 5:00 pm here at [facility]----arrival time is 4:45 pm for check in --patient to have labe work drawn the week of 02/23/23.  She voiced her understanding.

## 2023-02-16 NOTE — Telephone Encounter (Signed)
Left message for patient to call and discuss scheduling the CTA chest/aorta ordered by Dr. Christopher 

## 2023-02-17 NOTE — Telephone Encounter (Signed)
Can cancel the CT scan ordered for 03/24/2023

## 2023-02-27 LAB — BASIC METABOLIC PANEL
BUN/Creatinine Ratio: 18 (ref 12–28)
BUN: 12 mg/dL (ref 8–27)
CO2: 27 mmol/L (ref 20–29)
Calcium: 9.5 mg/dL (ref 8.7–10.3)
Chloride: 106 mmol/L (ref 96–106)
Creatinine, Ser: 0.67 mg/dL (ref 0.57–1.00)
Glucose: 84 mg/dL (ref 70–99)
Potassium: 4.2 mmol/L (ref 3.5–5.2)
Sodium: 144 mmol/L (ref 134–144)
eGFR: 99 mL/min/{1.73_m2} (ref >59)

## 2023-03-05 ENCOUNTER — Ambulatory Visit (HOSPITAL_BASED_OUTPATIENT_CLINIC_OR_DEPARTMENT_OTHER)
Admission: RE | Admit: 2023-03-05 | Discharge: 2023-03-05 | Disposition: A | Discharge status: Home / Self Care | Payer: No Typology Code available for payment source | Source: Ambulatory Visit | Attending: Cardiology | Admitting: Cardiology

## 2023-03-05 DIAGNOSIS — I7121 Aneurysm of the ascending aorta, without rupture: Secondary | ICD-10-CM | POA: Insufficient documentation

## 2023-03-05 MED ORDER — IOHEXOL 350 MG/ML SOLN
100.0000 mL | Freq: Once | INTRAVENOUS | Status: AC | PRN
  Administered 2023-03-05: 75 mL via INTRAVENOUS

## 2023-03-12 ENCOUNTER — Ambulatory Visit: Admit: 2023-03-12 | Discharge: 2023-03-12 | Payer: PRIVATE HEALTH INSURANCE

## 2023-03-12 ENCOUNTER — Ambulatory Visit: Admit: 2023-03-12 | Discharge: 2023-03-12 | Payer: PRIVATE HEALTH INSURANCE | Attending: Family | Primary: Family

## 2023-03-12 DIAGNOSIS — Z79631 Methotrexate, long term, current use: Principal | ICD-10-CM

## 2023-03-12 DIAGNOSIS — M059 Rheumatoid arthritis with rheumatoid factor, unspecified: Principal | ICD-10-CM

## 2023-03-12 DIAGNOSIS — D849 Immunodeficiency, unspecified: Principal | ICD-10-CM

## 2023-03-20 DIAGNOSIS — M059 Rheumatoid arthritis with rheumatoid factor, unspecified: Principal | ICD-10-CM

## 2023-03-20 DIAGNOSIS — Z79631 Methotrexate, long term, current use: Principal | ICD-10-CM

## 2023-03-20 MED ORDER — METHOTREXATE SODIUM 2.5 MG TABLET
ORAL_TABLET | 0 refills | 0 days
Start: 2023-03-20 — End: ?

## 2023-03-23 ENCOUNTER — Other Ambulatory Visit (HOSPITAL_BASED_OUTPATIENT_CLINIC_OR_DEPARTMENT_OTHER): Payer: Self-pay

## 2023-03-23 DIAGNOSIS — I7121 Aneurysm of the ascending aorta, without rupture: Secondary | ICD-10-CM

## 2023-03-23 MED ORDER — METHOTREXATE SODIUM 2.5 MG TABLET
ORAL_TABLET | 0 refills | 0 days | Status: CP
Start: 2023-03-23 — End: ?

## 2023-03-24 ENCOUNTER — Other Ambulatory Visit: Payer: No Typology Code available for payment source

## 2023-03-24 DIAGNOSIS — M059 Rheumatoid arthritis with rheumatoid factor, unspecified: Principal | ICD-10-CM

## 2023-04-30 ENCOUNTER — Ambulatory Visit (HOSPITAL_BASED_OUTPATIENT_CLINIC_OR_DEPARTMENT_OTHER): Payer: No Typology Code available for payment source | Admitting: Cardiology

## 2023-04-30 ENCOUNTER — Encounter (HOSPITAL_BASED_OUTPATIENT_CLINIC_OR_DEPARTMENT_OTHER): Payer: Self-pay | Admitting: Cardiology

## 2023-04-30 VITALS — BP 126/88 | HR 63 | Ht 65.0 in | Wt 184.0 lb

## 2023-04-30 DIAGNOSIS — R011 Cardiac murmur, unspecified: Secondary | ICD-10-CM

## 2023-04-30 DIAGNOSIS — I700 Atherosclerosis of aorta: Secondary | ICD-10-CM | POA: Diagnosis not present

## 2023-04-30 DIAGNOSIS — I2510 Atherosclerotic heart disease of native coronary artery without angina pectoris: Secondary | ICD-10-CM | POA: Diagnosis not present

## 2023-04-30 DIAGNOSIS — E7800 Pure hypercholesterolemia, unspecified: Secondary | ICD-10-CM

## 2023-04-30 DIAGNOSIS — I7121 Aneurysm of the ascending aorta, without rupture: Secondary | ICD-10-CM | POA: Diagnosis not present

## 2023-04-30 NOTE — Progress Notes (Signed)
Cardiology Office Note:  .    Date:  04/30/2023  ID:  Ave Filter, DOB April 27, 1961, MRN [id number] PCP: Noberto Retort, MD  [facility] Providers Cardiologist:  Jodelle Red, MD     History of Present Illness: .    Carla Holloway is a 62 y.o. female with a hx of rheumatoid arthritis, interstitial lung disease, sleep apnea, who is seen for follow up today. I initially met her 05/23/21 as a new consult at the request of Johny Blamer, MD for the evaluation and management of ascending aortic aneurysm.   Cardiovascular risk factors: Comorbid conditions: TAA (4.1 cm). Hypercholesterolemia, did not tolerate atorvastatin due to muscle cramps. Metabolic syndrome/Obesity: highest adult weight 225 lbs. Chronic inflammatory conditions: rheumatoid arthritis, on immunosuppression (etanercept, hydorxychloroquine, prednisone, methotrexate). About to start Humira. Prior cardiac testing and/or incidental findings on other testing (ie coronary calcium): coronary calcium noted on CT chest 03/12/21. Had echo, stress test at [facility] ~10 years ago, told it was normal. Done for intermittent PVCs, now well controlled when she monitors caffeine.   At her visit 03/2022, we reviewed her concerns: two episodes of dizziness after showering/taking a hot bath. May have lost consciousness; suspected vasovagal episodes. Recommended cooler bathing, sitting down once she gets out, avoiding fast movement. Went to ER with chest pain/shortness of breath, told it was pleurisy. Could occasionally have pain with changing position. Had started CPAP for sleep apnea. Was trying to work on [employer] changes. Reviewed CT results. Scan from 10/20/21 specifically says no TAA, but on my review it is stable at 41 mm. Blood pressure well controlled. On rosuvastatin 20 mg daily for coronary and aortic atherosclerosis.    Seen by Eligha Bridegroom, NP 07/2022 for preoperative cardiovascular assessment for pending  colonoscopy. She was doing well from a cardiac perspective. She had a CTA of the chest/aorta 02/2023 revealing top-normal ascending aorta measuring up to 4 cm in maximal diameter, no aortic dissection, no acute intrathoracic pathology, stable 4 mm left lower lobe pulmonary nodule.  Today, she states she is feeling well. No new concerns today. We reviewed her CTA in detail. Also reviewed her last lipid panel showing LDL 80  May have had a minor episode of chest pain in the setting of moving heavy objects. Was an isolated episode and not severe. No further issues with presyncope.  Continues to work on dietary changes, sometimes struggles with following a consistent diet. She tries to have a vegetarian diet 2-3 days out of the week, and limits red meat or pork to once a week.  She denies any palpitations, shortness of breath, peripheral edema, lightheadedness, headaches, orthopnea, or PND.  ROS:  Please see the history of present illness. ROS otherwise negative except as noted.   Studies Reviewed: Marland Kitchen    EKG Interpretation Date/Time:  Thursday April 30 2023 15:33:17 EDT Ventricular Rate:  63 PR Interval:  182 QRS Duration:  94 QT Interval:  420 QTC Calculation: 429 R Axis:   41  Text Interpretation: Normal sinus rhythm Normal ECG When compared with ECG of 20-Oct-2021 10:23, No significant change was found Confirmed by Jodelle Red [id number]) on 04/30/2023 3:37:58 PM    CTA Chest/Aorta  03/05/2023: IMPRESSION: 1. No acute intrathoracic pathology. No aortic dissection. 2. Top-normal ascending aorta measuring up to 4 cm in maximal diameter. Recommend annual imaging followup by CTA or MRA. This recommendation follows 2010 ACCF/AHA/AATS/ACR/ASA/SCA/SCAI/SIR/STS/SVM Guidelines for the Diagnosis and Management of Patients with Thoracic Aortic Disease. Circulation. 2010; 121: I347-Q259.  Aortic aneurysm NOS (ICD10-I71.9) 3. Stable 4 mm left lower lobe pulmonary nodule.  Physical Exam:     VS:  BP 126/88   Pulse 63   Ht 5\' 5"  (1.651 m)   Wt 184 lb (83.5 kg)   LMP 04/03/2015   SpO2 96%   BMI 30.62 kg/m    Wt Readings from Last 3 Encounters:  04/30/23 184 lb (83.5 kg)  12/17/22 185 lb (83.9 kg)  04/10/22 186 lb 14.4 oz (84.8 kg)    GEN: Well nourished, well developed in no acute distress HEENT: Normal, moist mucous membranes NECK: No JVD CARDIAC: regular rhythm, normal S1 and S2, no rubs or gallops. 1/6 soft systolic murmur. VASCULAR: Radial and DP pulses 2+ bilaterally. No carotid bruits RESPIRATORY:  Clear to auscultation without rales, wheezing or rhonchi  ABDOMEN: Soft, non-tender, non-distended MUSCULOSKELETAL:  Ambulates independently SKIN: Warm and dry, no edema NEUROLOGIC:  Alert and oriented x 3. No focal neuro deficits noted. PSYCHIATRIC:  Normal affect   ASSESSMENT AND PLAN: .    Murmur Thoracic aortic aneurysm -AoV calcification but it is a tricuspid leaflet valve. -TAA stable on imaging. Repeat ordered for next year. If stable on CTA, would consider changing to CT noncontrast (or whatever pulmonary prefers)   Coronary artery calcification Aortic atherosclerosis Hypercholesterolemia -tolerating rosuvastatin   Cardiac risk counseling and prevention recommendations: d -recommend heart healthy/Mediterranean diet, with whole grains, fruits, vegetable, fish, lean meats, nuts, and olive oil. Limit salt. -recommend moderate walking, 3-5 times/week for 30-50 minutes each session. Aim for at least 150 minutes.week. Goal should be pace of 3 miles/hours, or walking 1.5 miles in 30 minutes -recommend avoidance of tobacco products. Avoid excess alcohol.  Dispo: Follow-up in 1 year, or sooner as needed.  I,Mathew Stumpf,acting as a Neurosurgeon for [employer], MD.,have documented all relevant documentation on the behalf of Jodelle Red, MD,as directed by  Jodelle Red, MD while in the presence of Jodelle Red, MD.  I,  Jodelle Red, MD, have reviewed all documentation for this visit. The documentation on 04/30/23 for the exam, diagnosis, procedures, and orders are all accurate and complete.   Signed, Jodelle Red, MD

## 2023-04-30 NOTE — Patient Instructions (Signed)
Medication Instructions:  Continue current medications  *If you need a refill on your cardiac medications before your next appointment, please call your pharmacy*   Lab Work: None Ordered   Testing/Procedures: None Ordered   Follow-Up: At [facility], you and your health needs are our priority.  As part of our continuing mission to provide you with exceptional heart care, we have created designated Provider Care Teams.  These Care Teams include your primary Cardiologist (physician) and Advanced Practice Providers (APPs -  Physician Assistants and Nurse Practitioners) who all work together to provide you with the care you need, when you need it.  We recommend signing up for the patient portal called "MyChart".  Sign up information is provided on this After Visit Summary.  MyChart is used to connect with patients for Virtual Visits (Telemedicine).  Patients are able to view lab/test results, encounter notes, upcoming appointments, etc.  Non-urgent messages can be sent to your provider as well.   To learn more about what you can do with MyChart, go to [email or web address].    Your next appointment:   1 year(s)  Provider:   Bridgette Christopher, MD    Other Instructions   

## 2023-05-04 ENCOUNTER — Other Ambulatory Visit: Payer: Self-pay

## 2023-05-04 DIAGNOSIS — R0602 Shortness of breath: Secondary | ICD-10-CM

## 2023-05-05 ENCOUNTER — Encounter: Payer: Self-pay | Admitting: Pulmonary Disease

## 2023-05-05 ENCOUNTER — Ambulatory Visit (INDEPENDENT_AMBULATORY_CARE_PROVIDER_SITE_OTHER): Payer: No Typology Code available for payment source | Admitting: Pulmonary Disease

## 2023-05-05 VITALS — BP 112/74 | HR 68 | Temp 97.5°F | Ht 65.0 in | Wt 186.4 lb

## 2023-05-05 DIAGNOSIS — R0602 Shortness of breath: Secondary | ICD-10-CM | POA: Diagnosis not present

## 2023-05-05 DIAGNOSIS — R911 Solitary pulmonary nodule: Secondary | ICD-10-CM

## 2023-05-05 LAB — PULMONARY FUNCTION TEST
DL/VA % pred: 124 %
DL/VA: 5.21 ml/min/mmHg/L
DLCO cor % pred: 131 %
DLCO cor: 27.33 ml/min/mmHg
DLCO unc % pred: 130 %
DLCO unc: 27.16 ml/min/mmHg
FEF 25-75 Post: 3.18 L/sec
FEF 25-75 Pre: 2.48 L/s
FEF2575-%Change-Post: 28 %
FEF2575-%Pred-Post: 134 %
FEF2575-%Pred-Pre: 104 %
FEV1-%Change-Post: 8 %
FEV1-%Pred-Post: 111 %
FEV1-%Pred-Pre: 103 %
FEV1-Post: 2.93 L
FEV1-Pre: 2.71 L
FEV1FVC-%Change-Post: 6 %
FEV1FVC-%Pred-Pre: 98 %
FEV6-%Change-Post: 1 %
FEV6-%Pred-Post: 108 %
FEV6-%Pred-Pre: 107 %
FEV6-Post: 3.57 L
FEV6-Pre: 3.52 L
FEV6FVC-%Pred-Post: 103 %
FEV6FVC-%Pred-Pre: 103 %
FVC-%Change-Post: 1 %
FVC-%Pred-Post: 105 %
FVC-%Pred-Pre: 103 %
FVC-Post: 3.59 L
FVC-Pre: 3.53 L
Post FEV1/FVC ratio: 82 %
Post FEV6/FVC ratio: 100 %
Pre FEV1/FVC ratio: 77 %
Pre FEV6/FVC Ratio: 100 %
RV % pred: 93 %
RV: 1.93 L
TLC % pred: 107 %
TLC: 5.57 L

## 2023-05-05 NOTE — Patient Instructions (Signed)
Full PFT performed today. °

## 2023-05-05 NOTE — Progress Notes (Signed)
Carla Holloway    [id number]    10/14/1960  Primary Care Physician:Harris, Tawni Pummel, MD  Referring Physician: Noberto Retort, MD [phone] [address] [place],  [place] [address]  Chief complaint: Follow-up for groundglass changes on CT Does have occasional chest discomfort  HPI:  No recent changes in symptoms On Humira for rheumatoid arthritis  Follows up regularly  Recent CT scan-unchanged from previous, 4 mm nodule has been stable Had a PFT done today showing no significant change from previous  She does have an occasional cough sometimes productive of just minimal amount of phlegm  No recent exacerbation of symptoms  She does have obstructive sleep apnea has been on CPAP therapy  Breathing feels about the same thank you Sleeping well with CPAP  No limitations with respect to activities of daily living  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   Biotin 2500 MCG CAPS Take 2,500 mcg by mouth daily.   Calcium Polycarbophil (FIBER-CAPS PO) Take 3 tablets by mouth daily.   Calcium-Magnesium-Vitamin D (CALCIUM 1200+D3 PO) Take 2 tablets by mouth daily. 5000 units   Cyanocobalamin (VITAMIN B-12) 3000 MCG SUBL Place 3,000 mcg under the tongue daily.   docusate sodium (COLACE) 50 MG capsule Take 100 mg by mouth 2 (two) times daily.   Fexofenadine HCl (ALLEGRA PO) Take 180 mg by mouth daily.   folic acid (FOLVITE) 1 MG tablet Take 1 mg by mouth daily.   HADLIMA PUSHTOUCH 40 MG/0.4ML SOAJ Inject 40 mg into the skin every 14 (fourteen) days.   hydroxychloroquine (PLAQUENIL) 200 MG tablet Take 200 mg by mouth 2 (two) times daily.   levothyroxine (SYNTHROID) 175 MCG tablet Take 175 mcg by mouth daily before breakfast.   magnesium gluconate (MAGONATE) 500 MG tablet Take 500 mg by mouth daily.    methotrexate (RHEUMATREX) 2.5 MG tablet Take 5 tablets in the morning and 5 tablets in the evening on the same day once a week.   Omega-3 Fatty Acids (FISH OIL PO) Take 2 capsules by mouth 2 (two) times daily. Super   rosuvastatin (CRESTOR) 20 MG tablet Take 1 tablet (20 mg total) by mouth daily.   TURMERIC-GINGER PO Take 3 tablets by mouth daily. w /Bioperine 1950 mg each   No facility-administered encounter medications on file as of 05/05/2023.    Allergies as of 05/05/2023 - Review Complete 05/05/2023  Allergen Reaction Noted   Topiramate Other (See Comments) 07/31/2014   Simvastatin Other (See Comments) 12/12/2022   Atorvastatin Other (See Comments) 08/15/2020   Codeine Nausea And Vomiting and Nausea Only 07/31/2014   Tape Rash and Other (See Comments) 12/11/2016   Tramadol Nausea Only 08/15/2020    Past Medical History:  Diagnosis Date   Melanoma (HCC)    Thyroid disease     Past Surgical History:  Procedure Laterality Date   BREAST REDUCTION SURGERY     COLONOSCOPY WITH PROPOFOL Bilateral 12/17/2022   Procedure: COLONOSCOPY WITH PROPOFOL;  Surgeon: Willis Modena, MD;  Location: [facility];  Service: Gastroenterology;  Laterality: Bilateral;   melanoma removal     back     No family history on file.  Social History   Socioeconomic History   Marital status: Married    Spouse name: Not on file   Number  of children: Not on file   Years of education: Not on file   Highest education level: Not on file  Occupational History   Not on file  Tobacco Use   Smoking status: Never   Smokeless tobacco: Never  Substance and Sexual Activity   Alcohol use: Yes    Comment: occassionally   Drug use: No   Sexual activity: Yes    Birth control/protection: Pill  Other Topics Concern   Not on file  Social History Narrative   Not on file   Social Determinants of Health   Financial Resource Strain: Not on file  Food Insecurity: Not on file  Transportation Needs: Not on  file  Physical Activity: Not on file  Stress: Not on file  Social Connections: Not on file  Intimate Partner Violence: Not on file    Review of Systems  Constitutional: Negative.  Negative for fatigue.  Respiratory:  Negative for cough and shortness of breath.   Cardiovascular:  Negative for chest pain.    Vitals:   05/05/23 1552  BP: 112/74  Pulse: 68  Temp: (!) 97.5 F (36.4 C)  SpO2: 96%     Physical Exam Constitutional:      Appearance: She is obese.  HENT:     Head: Normocephalic and atraumatic.     Nose: No congestion.     Mouth/Throat:     Mouth: Mucous membranes are moist.  Eyes:     General:        Right eye: No discharge.        Left eye: No discharge.  Cardiovascular:     Rate and Rhythm: Normal rate and regular rhythm.     Heart sounds: No murmur heard.    No friction rub.  Pulmonary:     Effort: Pulmonary effort is normal. No respiratory distress.     Breath sounds: Normal breath sounds. No stridor. No wheezing or rhonchi.  Musculoskeletal:     Cervical back: No rigidity or tenderness.  Neurological:     Mental Status: She is alert.  Psychiatric:        Mood and Affect: Mood normal.    Data Reviewed: CT scan reviewed showing stable changes Pulmonary function test compared with 1 performed a year ago has been stable with no significant changes  Assessment:   Rheumatoid arthritis -Switched to Humira  Interstitial lung disease -Related to rheumatoid arthritis -Normal PFT, no significant progression on CT  Obstructive sleep apnea -On CPAP therapy  Semisolid nodule -4 mm nodule stable  Continues to follow with rheumatology  Ascending thoracic aortic aneurysm -Referral to vascular surgery for monitoring -Continues to follow-up -Will have another CT a year from now  Plan/Recommendations:  I will see you back in about a year Call us with significant concerns  No need to repeat your PFT at present  Virl Diamond MD [facility] 05/05/2023, 4:02 PM  CC: Noberto Retort, MD

## 2023-05-05 NOTE — Progress Notes (Signed)
Full PFT performed today. °

## 2023-05-05 NOTE — Patient Instructions (Signed)
Your breathing study is stable compared to a year ago  CAT scan is stable compared to a year ago  We do not need to repeat either 1 of this from a breathing perspective I know you will be getting another CAT scan for your aneurysm  Call us with significant concerns  I will see you a year from now

## 2023-05-17 ENCOUNTER — Other Ambulatory Visit (HOSPITAL_BASED_OUTPATIENT_CLINIC_OR_DEPARTMENT_OTHER): Payer: Self-pay | Admitting: Cardiology

## 2023-05-17 DIAGNOSIS — I2510 Atherosclerotic heart disease of native coronary artery without angina pectoris: Secondary | ICD-10-CM

## 2023-05-19 NOTE — Telephone Encounter (Signed)
Rx request sent to pharmacy.  

## 2023-06-09 DIAGNOSIS — M059 Rheumatoid arthritis with rheumatoid factor, unspecified: Principal | ICD-10-CM

## 2023-06-09 DIAGNOSIS — Z79631 Methotrexate, long term, current use: Principal | ICD-10-CM

## 2023-06-09 MED ORDER — METHOTREXATE SODIUM 2.5 MG TABLET
ORAL_TABLET | ORAL | 0 refills | 84 days | Status: CP
Start: 2023-06-09 — End: 2024-06-08

## 2023-07-23 ENCOUNTER — Ambulatory Visit: Admit: 2023-07-23 | Discharge: 2023-07-24 | Payer: PRIVATE HEALTH INSURANCE | Attending: Family | Primary: Family

## 2023-07-23 DIAGNOSIS — M059 Rheumatoid arthritis with rheumatoid factor, unspecified: Principal | ICD-10-CM

## 2023-07-23 DIAGNOSIS — Z23 Encounter for immunization: Principal | ICD-10-CM

## 2023-07-23 DIAGNOSIS — Z79631 Methotrexate, long term, current use: Principal | ICD-10-CM

## 2023-09-04 DIAGNOSIS — Z79631 Methotrexate, long term, current use: Principal | ICD-10-CM

## 2023-09-04 DIAGNOSIS — M059 Rheumatoid arthritis with rheumatoid factor, unspecified: Principal | ICD-10-CM

## 2023-09-04 MED ORDER — METHOTREXATE SODIUM 2.5 MG TABLET
ORAL_TABLET | ORAL | 0 refills | 84.00 days | Status: CP
Start: 2023-09-04 — End: 2024-09-03

## 2023-09-29 ENCOUNTER — Emergency Department (HOSPITAL_COMMUNITY): Payer: No Typology Code available for payment source

## 2023-09-29 ENCOUNTER — Emergency Department (HOSPITAL_COMMUNITY)
Admission: EM | Admit: 2023-09-29 | Discharge: 2023-09-29 | Discharge status: Against Medical Advice | Payer: No Typology Code available for payment source | Attending: Emergency Medicine | Admitting: Emergency Medicine

## 2023-09-29 ENCOUNTER — Encounter (HOSPITAL_COMMUNITY): Payer: Self-pay | Admitting: Emergency Medicine

## 2023-09-29 DIAGNOSIS — R42 Dizziness and giddiness: Secondary | ICD-10-CM | POA: Insufficient documentation

## 2023-09-29 DIAGNOSIS — Z5321 Procedure and treatment not carried out due to patient leaving prior to being seen by health care provider: Secondary | ICD-10-CM | POA: Diagnosis not present

## 2023-09-29 DIAGNOSIS — M25519 Pain in unspecified shoulder: Secondary | ICD-10-CM | POA: Diagnosis not present

## 2023-09-29 LAB — URINALYSIS, ROUTINE W REFLEX MICROSCOPIC
Bilirubin Urine: NEGATIVE
Glucose, UA: NEGATIVE mg/dL
Hgb urine dipstick: NEGATIVE
Ketones, ur: NEGATIVE mg/dL
Leukocytes,Ua: NEGATIVE
Nitrite: NEGATIVE
Protein, ur: NEGATIVE mg/dL
Specific Gravity, Urine: 1.015 (ref 1.005–1.030)
pH: 6 (ref 5.0–8.0)

## 2023-09-29 LAB — CBC
HCT: 40.7 % (ref 36.0–46.0)
Hemoglobin: 13.4 g/dL (ref 12.0–15.0)
MCH: 34.1 pg — ABNORMAL HIGH (ref 26.0–34.0)
MCHC: 32.9 g/dL (ref 30.0–36.0)
MCV: 103.6 fL — ABNORMAL HIGH (ref 80.0–100.0)
Platelets: 220 10*3/uL (ref 150–400)
RBC: 3.93 MIL/uL (ref 3.87–5.11)
RDW: 13.7 % (ref 11.5–15.5)
WBC: 4 10*3/uL (ref 4.0–10.5)
nRBC: 0 % (ref 0.0–0.2)

## 2023-09-29 LAB — BASIC METABOLIC PANEL
Anion gap: 4 — ABNORMAL LOW (ref 5–15)
BUN: 17 mg/dL (ref 8–23)
CO2: 26 mmol/L (ref 22–32)
Calcium: 9 mg/dL (ref 8.9–10.3)
Chloride: 107 mmol/L (ref 98–111)
Creatinine, Ser: 0.61 mg/dL (ref 0.44–1.00)
GFR, Estimated: >60 mL/min (ref >60)
Glucose, Bld: 106 mg/dL — ABNORMAL HIGH (ref 70–99)
Potassium: 4.1 mmol/L (ref 3.5–5.1)
Sodium: 137 mmol/L (ref 135–145)

## 2023-09-29 LAB — TROPONIN I (HIGH SENSITIVITY): Troponin I (High Sensitivity): 3 ng/L (ref <18)

## 2023-09-29 NOTE — ED Triage Notes (Signed)
 Pt here from home with c/o with c/o dizziness and shoulder pain this am , no n/v

## 2023-10-19 DIAGNOSIS — M059 Rheumatoid arthritis with rheumatoid factor, unspecified: Principal | ICD-10-CM

## 2023-10-19 MED ORDER — HADLIMA(CF) PUSHTOUCH 40 MG/0.4 ML SUBCUTANEOUS AUTO-INJECTOR
3 refills | 0.00 days
Start: 2023-10-19 — End: ?

## 2023-10-20 MED ORDER — HADLIMA(CF) PUSHTOUCH 40 MG/0.4 ML SUBCUTANEOUS AUTO-INJECTOR
SUBCUTANEOUS | 3 refills | 0.00 days | Status: CP
Start: 2023-10-20 — End: 2024-10-19

## 2023-10-22 ENCOUNTER — Ambulatory Visit
Admit: 2023-10-22 | Discharge: 2023-10-22 | Payer: PRIVATE HEALTH INSURANCE | Attending: Rheumatology | Primary: Rheumatology

## 2023-10-22 DIAGNOSIS — M059 Rheumatoid arthritis with rheumatoid factor, unspecified: Principal | ICD-10-CM

## 2023-11-10 ENCOUNTER — Ambulatory Visit: Admit: 2023-11-10 | Discharge: 2023-11-11 | Payer: PRIVATE HEALTH INSURANCE

## 2023-11-10 DIAGNOSIS — M1990 Unspecified osteoarthritis, unspecified site: Principal | ICD-10-CM

## 2023-11-10 DIAGNOSIS — M059 Rheumatoid arthritis with rheumatoid factor, unspecified: Principal | ICD-10-CM

## 2023-11-10 DIAGNOSIS — D849 Immunodeficiency, unspecified: Principal | ICD-10-CM

## 2023-11-10 DIAGNOSIS — Z79631 Methotrexate, long term, current use: Principal | ICD-10-CM

## 2023-11-10 MED ORDER — HYDROXYCHLOROQUINE 200 MG TABLET
ORAL_TABLET | Freq: Two times a day (BID) | ORAL | 3 refills | 90.00 days | Status: CP
Start: 2023-11-10 — End: ?

## 2023-11-10 MED ORDER — METHOTREXATE SODIUM 2.5 MG TABLET
ORAL_TABLET | ORAL | 0 refills | 84.00 days | Status: CP
Start: 2023-11-10 — End: 2024-11-09

## 2023-11-22 ENCOUNTER — Other Ambulatory Visit (HOSPITAL_BASED_OUTPATIENT_CLINIC_OR_DEPARTMENT_OTHER): Payer: Self-pay | Admitting: Cardiology

## 2023-11-22 DIAGNOSIS — I2510 Atherosclerotic heart disease of native coronary artery without angina pectoris: Secondary | ICD-10-CM

## 2023-12-14 ENCOUNTER — Encounter (HOSPITAL_BASED_OUTPATIENT_CLINIC_OR_DEPARTMENT_OTHER): Payer: Self-pay

## 2024-02-12 ENCOUNTER — Telehealth (HOSPITAL_BASED_OUTPATIENT_CLINIC_OR_DEPARTMENT_OTHER): Payer: Self-pay | Admitting: Cardiology

## 2024-02-12 NOTE — Telephone Encounter (Signed)
 Patient called to follow-up on getting pre-authorization to have her CT ANGIO CHEST AORTA W/CM &/OR test as required by her insurance and wants a call back to discuss next steps.

## 2024-02-15 ENCOUNTER — Encounter (HOSPITAL_BASED_OUTPATIENT_CLINIC_OR_DEPARTMENT_OTHER): Payer: Self-pay

## 2024-02-15 NOTE — Telephone Encounter (Signed)
 Patient called again to follow-up on pre-certification for CT ANGIO CHEST AORTA test scheduled on 7/1.

## 2024-02-15 NOTE — Telephone Encounter (Signed)
**Note De-identified  [place] Obfuscation** Please advise 

## 2024-02-23 DIAGNOSIS — Z79631 Methotrexate, long term, current use: Principal | ICD-10-CM

## 2024-02-23 DIAGNOSIS — M059 Rheumatoid arthritis with rheumatoid factor, unspecified: Principal | ICD-10-CM

## 2024-02-23 MED ORDER — METHOTREXATE SODIUM 2.5 MG TABLET
ORAL_TABLET | ORAL | 3 refills | 84.00000 days
Start: 2024-02-23 — End: 2025-02-22

## 2024-02-24 MED ORDER — METHOTREXATE SODIUM 2.5 MG TABLET
ORAL_TABLET | ORAL | 3 refills | 84.00000 days | Status: CP
Start: 2024-02-24 — End: 2025-02-23

## 2024-03-02 ENCOUNTER — Encounter (HOSPITAL_BASED_OUTPATIENT_CLINIC_OR_DEPARTMENT_OTHER): Payer: Self-pay

## 2024-03-15 ENCOUNTER — Ambulatory Visit (HOSPITAL_COMMUNITY)
Admission: RE | Admit: 2024-03-15 | Discharge: 2024-03-15 | Disposition: A | Discharge status: Home / Self Care | Source: Ambulatory Visit | Attending: Family | Admitting: Family

## 2024-03-15 DIAGNOSIS — I7121 Aneurysm of the ascending aorta, without rupture: Secondary | ICD-10-CM | POA: Diagnosis present

## 2024-03-15 MED ORDER — IOHEXOL 350 MG/ML SOLN
75.0000 mL | Freq: Once | INTRAVENOUS | Status: AC | PRN
  Administered 2024-03-15: 75 mL via INTRAVENOUS

## 2024-03-16 ENCOUNTER — Ambulatory Visit (HOSPITAL_BASED_OUTPATIENT_CLINIC_OR_DEPARTMENT_OTHER): Payer: Self-pay | Admitting: Family

## 2024-03-29 ENCOUNTER — Ambulatory Visit (INDEPENDENT_AMBULATORY_CARE_PROVIDER_SITE_OTHER): Admitting: Pulmonary Disease

## 2024-03-29 VITALS — BP 124/77 | HR 62 | Ht 65.0 in | Wt 185.5 lb

## 2024-03-29 DIAGNOSIS — R911 Solitary pulmonary nodule: Secondary | ICD-10-CM | POA: Diagnosis not present

## 2024-03-29 DIAGNOSIS — J849 Interstitial pulmonary disease, unspecified: Secondary | ICD-10-CM | POA: Diagnosis not present

## 2024-03-29 DIAGNOSIS — M069 Rheumatoid arthritis, unspecified: Secondary | ICD-10-CM | POA: Diagnosis not present

## 2024-03-29 DIAGNOSIS — I7121 Aneurysm of the ascending aorta, without rupture: Secondary | ICD-10-CM

## 2024-03-29 NOTE — Patient Instructions (Signed)
 I will see you back in about 2 years  Call us  with any significant concerns  There is no reason to repeat your CAT scan based on the scarring in the lungs or the lung nodule which have been stable  Call us  with significant concerns

## 2024-03-29 NOTE — Progress Notes (Signed)
 Carla Holloway    [id number]    [birth year]  Primary Care Physician:Harris, Elsie SAUNDERS, MD  Referring Physician: Arloa Elsie SAUNDERS, MD [phone] [name] [place],  [place] [address]  Chief complaint: Follow-up for groundglass changes on CT Has been doing relatively well with no significant concerns today   HPI:  Patient with rheumatoid arthritis, on Humira  Continues to follow-up regularly  Does have a 4 mm left lung nodule-this has remained stable, recently had a CT scan of the chest-nodule is stable, PFT has been stable from previous as well  No significant coughing, no significant shortness of breath  Denies any significant exacerbation of symptoms  She does have obstructive sleep apnea has been on CPAP therapy  Breathing feels about the same thank you Sleeping well with CPAP  No limitations with respect to activities of daily living  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   Biotin  2500 MCG CAPS Take 2,500 mcg by mouth daily.   Calcium  Polycarbophil (FIBER-CAPS PO) Take 3 tablets by mouth daily.   Calcium -Magnesium-Vitamin D (CALCIUM  1200+D3 PO) Take 2 tablets by mouth daily. 5000 units   Cyanocobalamin (VITAMIN B-12) 3000 MCG SUBL Place 3,000 mcg under the tongue daily.   docusate sodium (COLACE) 50 MG capsule Take 100 mg by mouth 2 (two) times daily.   Fexofenadine HCl (ALLEGRA PO) Take 180 mg by mouth daily.   folic acid (FOLVITE) 1 MG tablet Take 1 mg by mouth daily.   HADLIMA PUSHTOUCH 40 MG/0.4ML SOAJ Inject 40 mg into the skin every 14 (fourteen) days.   hydroxychloroquine (PLAQUENIL) 200 MG tablet Take 200 mg by mouth 2 (two) times daily.   levothyroxine (SYNTHROID) 175 MCG tablet Take 175 mcg by mouth daily before breakfast.   magnesium gluconate (MAGONATE) 500 MG tablet  Take 500 mg by mouth daily.   methotrexate (RHEUMATREX) 2.5 MG tablet Take 5 tablets in the morning and 5 tablets in the evening on the same day once a week.   Omega-3 Fatty Acids (FISH OIL PO) Take 2 capsules by mouth 2 (two) times daily. Super   rosuvastatin  (CRESTOR ) 20 MG tablet TAKE 1 TABLET BY MOUTH EVERY DAY   TURMERIC-GINGER PO Take 3 tablets by mouth daily. w /Bioperine 1950 mg each   No facility-administered encounter medications on file as of 03/29/2024.    Allergies as of 03/29/2024 - Review Complete 03/29/2024  Allergen Reaction Noted   Topiramate Other (See Comments) 07/31/2014   Simvastatin Other (See Comments) 12/12/2022   Atorvastatin Other (See Comments) 08/15/2020   Codeine Nausea And Vomiting and Nausea Only 07/31/2014   Tape Rash and Other (See Comments) 12/11/2016   Tramadol Nausea Only 08/15/2020    Past Medical History:  Diagnosis Date   Melanoma (HCC)    Thyroid  disease     Past Surgical History:  Procedure Laterality Date   BREAST REDUCTION SURGERY     COLONOSCOPY WITH PROPOFOL  Bilateral 12/17/2022   Procedure: COLONOSCOPY WITH PROPOFOL ;  Surgeon: Burnette Elsie, MD;  Location: [facility];  Service: Gastroenterology;  Laterality: Bilateral;   melanoma removal     back     No family history on file.  Social History   Socioeconomic History   Marital status: Married    Spouse name: Not on file  Number of children: Not on file   Years of education: Not on file   Highest education level: Not on file  Occupational History   Not on file  Tobacco Use   Smoking status: Never   Smokeless tobacco: Never  Substance and Sexual Activity   Alcohol use: Yes    Comment: occassionally   Drug use: No   Sexual activity: Yes    Birth control/protection: Pill  Other Topics Concern   Not on file  Social History Narrative   Not on file   Social Drivers of Health   Financial Resource Strain: Not on file  Food Insecurity: Low Risk  (02/01/2024)    Received from [facility]   Hunger Vital Sign    Within the past 12 months, you worried that your food would run out before you got money to buy more: Never true    Within the past 12 months, the food you bought just didn't last and you didn't have money to get more. : Never true  Transportation Needs: No Transportation Needs (02/01/2024)   Received from [facility]    In the past 12 months, has lack of reliable transportation kept you from medical appointments, meetings, work or from getting things needed for daily living? : No  Physical Activity: Not on file  Stress: Not on file  Social Connections: Not on file  Intimate Partner Violence: Not on file    Review of Systems  Constitutional: Negative.  Negative for fatigue.  Respiratory:  Negative for cough and shortness of breath.   Cardiovascular:  Negative for chest pain.    Vitals:   03/29/24 1118 03/29/24 1119  BP:  124/77  Pulse: 62 62  SpO2: 95% 95%     Physical Exam Constitutional:      Appearance: She is obese.  HENT:     Head: Normocephalic and atraumatic.     Nose: No congestion.     Mouth/Throat:     Mouth: Mucous membranes are moist.  Eyes:     General:        Right eye: No discharge.        Left eye: No discharge.  Cardiovascular:     Rate and Rhythm: Normal rate and regular rhythm.     Heart sounds: No murmur heard.    No friction rub.  Pulmonary:     Effort: Pulmonary effort is normal. No respiratory distress.     Breath sounds: Normal breath sounds. No stridor. No wheezing or rhonchi.  Musculoskeletal:     Cervical back: No rigidity or tenderness.  Neurological:     Mental Status: She is alert.  Psychiatric:        Mood and Affect: Mood normal.    Data Reviewed: CT scan reviewed showing stable changes-03/15/2024 Pulmonary function test compared with 1 performed a year ago has been stable with no significant changes  Assessment:   Rheumatoid arthritis - On  Humira  Interstitial lung disease - This is related to rheumatoid arthritis - PFT stable - CT scan changes stable  4 mm semisolid nodule at left base - This is stable  She continues to follow-up with rheumatology on a regular basis  Ascending thoracic aortic aneurysm - This is stable - The plan is to follow-up in 2 years with a CT   Plan/Recommendations:  Has been stable with no significant concerns  Lung nodule is stable  Will follow-up with CT in 2 years  I do believe patient  can be scheduled safely for 2-year follow-up as well  Encouraged to give us  a call if any significant concerns  Jennet Epley MD [facility] 03/29/2024, 11:35 AM  CC: Arloa Elsie SAUNDERS, MD

## 2024-04-05 ENCOUNTER — Ambulatory Visit: Admit: 2024-04-05 | Discharge: 2024-04-06 | Payer: PRIVATE HEALTH INSURANCE | Attending: Family | Primary: Family

## 2024-04-05 ENCOUNTER — Encounter: Admit: 2024-04-05 | Discharge: 2024-04-06 | Payer: PRIVATE HEALTH INSURANCE | Attending: Family | Primary: Family

## 2024-04-05 DIAGNOSIS — M059 Rheumatoid arthritis with rheumatoid factor, unspecified: Principal | ICD-10-CM

## 2024-04-05 DIAGNOSIS — Z79631 Methotrexate, long term, current use: Principal | ICD-10-CM

## 2024-04-05 MED ORDER — ACTEMRA ACTPEN 162 MG/0.9 ML SUBCUTANEOUS PEN INJECTOR
SUBCUTANEOUS | 3 refills | 0.00000 days | Status: CP
Start: 2024-04-05 — End: 2025-04-05

## 2024-04-06 DIAGNOSIS — M059 Rheumatoid arthritis with rheumatoid factor, unspecified: Principal | ICD-10-CM

## 2024-04-06 MED ORDER — ACTEMRA ACTPEN 162 MG/0.9 ML SUBCUTANEOUS PEN INJECTOR
SUBCUTANEOUS | 3 refills | 0.00000 days | Status: CP
Start: 2024-04-06 — End: 2025-04-06

## 2024-04-14 DIAGNOSIS — M059 Rheumatoid arthritis with rheumatoid factor, unspecified: Principal | ICD-10-CM

## 2024-04-14 MED ORDER — KEVZARA 200 MG/1.14 ML SUBCUTANEOUS PEN INJECTOR
SUBCUTANEOUS | 1 refills | 84.00000 days | Status: CP
Start: 2024-04-14 — End: ?

## 2024-04-19 DIAGNOSIS — M059 Rheumatoid arthritis with rheumatoid factor, unspecified: Principal | ICD-10-CM

## 2024-04-19 MED ORDER — KEVZARA 200 MG/1.14 ML SUBCUTANEOUS PEN INJECTOR
SUBCUTANEOUS | 1 refills | 70.00000 days | Status: CP
Start: 2024-04-19 — End: ?

## 2024-04-26 DIAGNOSIS — M059 Rheumatoid arthritis with rheumatoid factor, unspecified: Principal | ICD-10-CM

## 2024-05-03 NOTE — Progress Notes (Signed)
 Cardiology Office Note:  .    Date:  05/04/2024  ID:  Carla Holloway, DOB [birth year], MRN [id number] PCP: Arloa Elsie SAUNDERS, MD  [facility] Providers Cardiologist:  Shelda Bruckner, MD     History of Present Illness: .    Carla Holloway is a 64 y.o. female with a hx of rheumatoid arthritis, interstitial lung disease, sleep apnea, who is seen for follow up today. I initially met her 05/23/21 as a new consult at the request of Arloa Elsie, MD for the evaluation and management of ascending aortic aneurysm.   Cardiovascular risk factors: Comorbid conditions: TAA (up to 4.1 cm). Hypercholesterolemia, did not tolerate atorvastatin due to muscle cramps. Metabolic syndrome/Obesity: highest adult weight 225 lbs. Chronic inflammatory conditions: rheumatoid arthritis, on immunosuppression (etanercept, hydroxychloroquine, prednisone, methotrexate, Humira).   Today: Had a dizzy spell about 6 months ago, found to have ear infection. Now rare/mild symptoms.   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 Recent lipids at goal.  Does a walking video 3-4 times/week.   Only one episode of palpitations/PVCs, staying away from caffeine helps prevent this.  Denies chest pain, shortness of breath at rest or with normal exertion. No PND, orthopnea, LE edema or unexpected weight gain. No syncope. ROS otherwise negative except as noted.   ROS:  Please see the history of present illness.   Studies Reviewed: .         Reviewed CT aorta from 03/16/24, see below  Physical Exam:    VS:  BP 110/60 (BP Location: Right Arm, Patient Position: Sitting, Cuff Size: Normal)   Pulse 75   Ht 5' 5 (1.651 m)   Wt 186 lb 1.6 oz (84.4 kg)   LMP 04/03/2015   SpO2 99%   BMI 30.97 kg/m    Wt Readings from Last 3 Encounters:  05/04/24 186 lb 1.6 oz (84.4 kg)  03/29/24 185 lb 8 oz (84.1 kg)  05/05/23 186 lb 7.2 oz (84.6 kg)    GEN: Well nourished, well  developed in no acute distress HEENT: Normal, moist mucous membranes NECK: No JVD CARDIAC: regular rhythm, normal S1 and S2, no rubs or gallops. 1/6 soft systolic murmur. VASCULAR: Radial and DP pulses 2+ bilaterally. No carotid bruits RESPIRATORY:  Clear to auscultation without rales, wheezing or rhonchi  ABDOMEN: Soft, non-tender, non-distended MUSCULOSKELETAL:  Ambulates independently SKIN: Warm and dry, no edema NEUROLOGIC:  Alert and oriented x 3. No focal neuro deficits noted. PSYCHIATRIC:  Normal affect   ASSESSMENT AND PLAN: .    Murmur Thoracic aortic aneurysm -AoV calcification but it is a tricuspid leaflet valve. -TAA stable on imaging. Consider changing to CT noncontrast or echo every two years. Based on pulmonology following lung nodule, will do CT noncontrast in 2027, consider changing to echo if stable and no further pulm monitoring needed.   Coronary artery calcification Aortic atherosclerosis Hypercholesterolemia -tolerating rosuvastatin  -reviewed lipids from [facility] 03/04/24: Tchol 136, HDL 52, LDL 70, TG 70.   Rheumatoid arthritis -risk factor for ASCVD -on methotrexate, hydroxychloroquine, and about to start kevzara  Cardiac risk counseling and prevention recommendations: d -recommend heart healthy/Mediterranean diet, with whole grains, fruits, vegetable, fish, lean meats, nuts, and olive oil. Limit salt. -recommend moderate walking, 3-5 times/week for 30-50 minutes each session. Aim for at least 150 minutes.week. Goal should be pace of 3 miles/hours, or walking 1.5 miles in 30 minutes -recommend avoidance of tobacco products. Avoid excess alcohol.  Dispo: Follow-up in 1 year,  or sooner as needed.  Signed, Shelda Bruckner, MD

## 2024-05-04 ENCOUNTER — Ambulatory Visit (HOSPITAL_BASED_OUTPATIENT_CLINIC_OR_DEPARTMENT_OTHER): Admitting: Cardiology

## 2024-05-04 ENCOUNTER — Encounter (HOSPITAL_BASED_OUTPATIENT_CLINIC_OR_DEPARTMENT_OTHER): Payer: Self-pay | Admitting: Cardiology

## 2024-05-04 VITALS — BP 110/60 | HR 75 | Ht 65.0 in | Wt 186.1 lb

## 2024-05-04 DIAGNOSIS — R011 Cardiac murmur, unspecified: Secondary | ICD-10-CM | POA: Diagnosis not present

## 2024-05-04 DIAGNOSIS — I700 Atherosclerosis of aorta: Secondary | ICD-10-CM

## 2024-05-04 DIAGNOSIS — I2510 Atherosclerotic heart disease of native coronary artery without angina pectoris: Secondary | ICD-10-CM | POA: Diagnosis not present

## 2024-05-04 DIAGNOSIS — I7121 Aneurysm of the ascending aorta, without rupture: Secondary | ICD-10-CM

## 2024-05-04 DIAGNOSIS — E7800 Pure hypercholesterolemia, unspecified: Secondary | ICD-10-CM

## 2024-05-04 MED ORDER — ROSUVASTATIN CALCIUM 20 MG PO TABS: 20.0000 mg | ORAL_TABLET | Freq: Every day | ORAL | 3 refills | Status: AC

## 2024-05-04 NOTE — Patient Instructions (Signed)
 Medication Instructions:  Your physician recommends that you continue on your current medications as directed. Please refer to the Current Medication list given to you today.  *If you need a refill on your cardiac medications before your next appointment, please call your pharmacy*  Lab Work: NONE  Testing/Procedures: NONE  Follow-Up: At [facility], you and your health needs are our priority.  As part of our continuing mission to provide you with exceptional heart care, we have created designated Provider Care Teams.  These Care Teams include your primary Cardiologist (physician) and Advanced Practice Providers (APPs -  Physician Assistants and Nurse Practitioners) who all work together to provide you with the care you need, when you need it.  We recommend signing up for the patient portal called MyChart.  Sign up information is provided on this After Visit Summary.  MyChart is used to connect with patients for Virtual Visits (Telemedicine).  Patients are able to view lab/test results, encounter notes, upcoming appointments, etc.  Non-urgent messages can be sent to your provider as well.   To learn more about what you can do with MyChart, go to [email or web address].    Your next appointment:   12 month(s)  The format for your next appointment:   In Person  Provider:   Dr Lonni, Reche ORN NP, or Rosaline RAMAN NP

## 2024-09-19 ENCOUNTER — Ambulatory Visit
Admit: 2024-09-19 | Discharge: 2024-09-20 | Payer: PRIVATE HEALTH INSURANCE | Attending: Rheumatology | Primary: Rheumatology

## 2024-09-19 DIAGNOSIS — M1990 Unspecified osteoarthritis, unspecified site: Principal | ICD-10-CM

## 2024-09-19 DIAGNOSIS — M059 Rheumatoid arthritis with rheumatoid factor, unspecified: Principal | ICD-10-CM

## 2024-09-19 DIAGNOSIS — Z79631 Methotrexate, long term, current use: Principal | ICD-10-CM

## 2024-09-19 DIAGNOSIS — D849 Immunodeficiency, unspecified: Principal | ICD-10-CM

## 2024-09-21 DIAGNOSIS — D849 Immunodeficiency, unspecified: Principal | ICD-10-CM

## 2024-10-03 DIAGNOSIS — M059 Rheumatoid arthritis with rheumatoid factor, unspecified: Principal | ICD-10-CM

## 2024-10-03 MED ORDER — KEVZARA 200 MG/1.14 ML SUBCUTANEOUS PEN INJECTOR
1 refills | 0.00000 days
Start: 2024-10-03 — End: ?

## 2024-10-04 MED ORDER — KEVZARA 200 MG/1.14 ML SUBCUTANEOUS PEN INJECTOR
3 refills | 0.00000 days | Status: CP
Start: 2024-10-04 — End: 2025-10-04

## 2024-10-05 DIAGNOSIS — M059 Rheumatoid arthritis with rheumatoid factor, unspecified: Principal | ICD-10-CM

## 2024-10-05 MED ORDER — KEVZARA 200 MG/1.14 ML SUBCUTANEOUS PEN INJECTOR
4 refills | 0.00000 days
Start: 2024-10-05 — End: 2025-10-05

## 2024-10-20 DIAGNOSIS — M059 Rheumatoid arthritis with rheumatoid factor, unspecified: Principal | ICD-10-CM

## 2024-10-20 MED ORDER — SARILUMAB 150 MG/1.14 ML SUBCUTANEOUS PEN INJECTOR
SUBCUTANEOUS | 3 refills | 84.00000 days | Status: CP
Start: 2024-10-20 — End: ?
